# Patient Record
Sex: Female | Born: 1945 | Race: White | Hispanic: No | Marital: Married | Smoking: Never smoker
Health system: Southern US, Community
[De-identification: ages and names within clinical notes are randomized; demographics above are authoritative.]

## PROBLEM LIST (undated history)

## (undated) DIAGNOSIS — D649 Anemia, unspecified: Secondary | ICD-10-CM

## (undated) DIAGNOSIS — Z972 Presence of dental prosthetic device (complete) (partial): Secondary | ICD-10-CM

## (undated) DIAGNOSIS — K5521 Angiodysplasia of colon with hemorrhage: Secondary | ICD-10-CM

## (undated) DIAGNOSIS — N3281 Overactive bladder: Secondary | ICD-10-CM

## (undated) DIAGNOSIS — IMO0002 Reserved for concepts with insufficient information to code with codable children: Secondary | ICD-10-CM

## (undated) DIAGNOSIS — F341 Dysthymic disorder: Secondary | ICD-10-CM

## (undated) DIAGNOSIS — Z9989 Dependence on other enabling machines and devices: Secondary | ICD-10-CM

## (undated) DIAGNOSIS — C4491 Basal cell carcinoma of skin, unspecified: Secondary | ICD-10-CM

## (undated) DIAGNOSIS — K2970 Gastritis, unspecified, without bleeding: Secondary | ICD-10-CM

## (undated) DIAGNOSIS — J309 Allergic rhinitis, unspecified: Secondary | ICD-10-CM

## (undated) DIAGNOSIS — J45909 Unspecified asthma, uncomplicated: Secondary | ICD-10-CM

## (undated) DIAGNOSIS — E669 Obesity, unspecified: Secondary | ICD-10-CM

## (undated) DIAGNOSIS — D6851 Activated protein C resistance: Secondary | ICD-10-CM

## (undated) DIAGNOSIS — G4733 Obstructive sleep apnea (adult) (pediatric): Secondary | ICD-10-CM

## (undated) DIAGNOSIS — M1990 Unspecified osteoarthritis, unspecified site: Secondary | ICD-10-CM

## (undated) DIAGNOSIS — K219 Gastro-esophageal reflux disease without esophagitis: Secondary | ICD-10-CM

## (undated) DIAGNOSIS — F329 Major depressive disorder, single episode, unspecified: Secondary | ICD-10-CM

## (undated) DIAGNOSIS — F32A Depression, unspecified: Secondary | ICD-10-CM

## (undated) DIAGNOSIS — K31819 Angiodysplasia of stomach and duodenum without bleeding: Secondary | ICD-10-CM

## (undated) DIAGNOSIS — H3530 Unspecified macular degeneration: Secondary | ICD-10-CM

## (undated) HISTORY — DX: Obesity, unspecified: E66.9

## (undated) HISTORY — DX: Activated protein C resistance: D68.51

## (undated) HISTORY — DX: Dysthymic disorder: F34.1

## (undated) HISTORY — DX: Angiodysplasia of stomach and duodenum without bleeding: K31.819

## (undated) HISTORY — DX: Major depressive disorder, single episode, unspecified: F32.9

## (undated) HISTORY — DX: Depression, unspecified: F32.A

## (undated) HISTORY — DX: Gastro-esophageal reflux disease without esophagitis: K21.9

## (undated) HISTORY — DX: Anemia, unspecified: D64.9

## (undated) HISTORY — DX: Unspecified macular degeneration: H35.30

## (undated) HISTORY — DX: Basal cell carcinoma of skin, unspecified: C44.91

## (undated) HISTORY — DX: Unspecified asthma, uncomplicated: J45.909

## (undated) HISTORY — DX: Overactive bladder: N32.81

## (undated) HISTORY — DX: Obstructive sleep apnea (adult) (pediatric): G47.33

## (undated) HISTORY — DX: Angiodysplasia of colon with hemorrhage: K55.21

## (undated) HISTORY — DX: Dependence on other enabling machines and devices: Z99.89

## (undated) HISTORY — DX: Gastritis, unspecified, without bleeding: K29.70

## (undated) HISTORY — DX: Reserved for concepts with insufficient information to code with codable children: IMO0002

## (undated) HISTORY — DX: Allergic rhinitis, unspecified: J30.9

## (undated) HISTORY — PX: TONSILLECTOMY: SUR1361

---

## 1991-02-24 HISTORY — PX: CHOLECYSTECTOMY: SHX55

## 1994-02-23 HISTORY — PX: ABDOMINAL HYSTERECTOMY: SHX81

## 2004-01-29 ENCOUNTER — Ambulatory Visit: Payer: Self-pay | Admitting: Internal Medicine

## 2004-02-07 ENCOUNTER — Ambulatory Visit: Payer: Self-pay

## 2004-04-24 ENCOUNTER — Ambulatory Visit: Payer: Self-pay | Admitting: Internal Medicine

## 2004-04-29 ENCOUNTER — Encounter: Payer: Self-pay | Admitting: Internal Medicine

## 2004-05-12 ENCOUNTER — Ambulatory Visit: Payer: Self-pay | Admitting: Internal Medicine

## 2004-07-30 ENCOUNTER — Ambulatory Visit: Payer: Self-pay | Admitting: Internal Medicine

## 2004-09-16 ENCOUNTER — Encounter: Payer: Self-pay | Admitting: Gastroenterology

## 2004-10-02 ENCOUNTER — Ambulatory Visit: Payer: Self-pay | Admitting: Internal Medicine

## 2004-12-08 ENCOUNTER — Ambulatory Visit: Payer: Self-pay | Admitting: Internal Medicine

## 2005-01-12 ENCOUNTER — Ambulatory Visit: Payer: Self-pay | Admitting: Internal Medicine

## 2005-02-09 ENCOUNTER — Ambulatory Visit: Payer: Self-pay | Admitting: Family Medicine

## 2005-02-27 ENCOUNTER — Ambulatory Visit: Payer: Self-pay | Admitting: Internal Medicine

## 2005-09-14 ENCOUNTER — Ambulatory Visit: Payer: Self-pay | Admitting: Internal Medicine

## 2005-11-16 ENCOUNTER — Ambulatory Visit: Payer: Self-pay | Admitting: Internal Medicine

## 2005-12-29 ENCOUNTER — Ambulatory Visit: Payer: Self-pay | Admitting: Internal Medicine

## 2006-01-28 ENCOUNTER — Ambulatory Visit: Payer: Self-pay | Admitting: Internal Medicine

## 2006-06-10 ENCOUNTER — Encounter: Payer: Self-pay | Admitting: Family Medicine

## 2006-06-10 ENCOUNTER — Ambulatory Visit: Payer: Self-pay | Admitting: Family Medicine

## 2006-06-10 DIAGNOSIS — K219 Gastro-esophageal reflux disease without esophagitis: Secondary | ICD-10-CM | POA: Insufficient documentation

## 2006-06-10 DIAGNOSIS — M1990 Unspecified osteoarthritis, unspecified site: Secondary | ICD-10-CM | POA: Insufficient documentation

## 2006-06-10 DIAGNOSIS — E669 Obesity, unspecified: Secondary | ICD-10-CM | POA: Insufficient documentation

## 2006-06-10 DIAGNOSIS — J309 Allergic rhinitis, unspecified: Secondary | ICD-10-CM | POA: Insufficient documentation

## 2006-06-10 LAB — CONVERTED CEMR LAB
Bilirubin Urine: NEGATIVE
Glucose, Urine, Semiquant: NEGATIVE
Specific Gravity, Urine: 1.02
pH: 6

## 2006-08-23 ENCOUNTER — Ambulatory Visit: Payer: Self-pay | Admitting: Internal Medicine

## 2006-08-23 DIAGNOSIS — R42 Dizziness and giddiness: Secondary | ICD-10-CM | POA: Insufficient documentation

## 2006-08-24 LAB — CONVERTED CEMR LAB
Alkaline Phosphatase: 105 units/L (ref 39–117)
Calcium: 9.6 mg/dL (ref 8.4–10.5)
Chloride: 110 meq/L (ref 96–112)
Eosinophils Absolute: 0.2 10*3/uL (ref 0.0–0.6)
Eosinophils Relative: 2.1 % (ref 0.0–5.0)
GFR calc Af Amer: 109 mL/min
GFR calc non Af Amer: 90 mL/min
Glucose, Bld: 85 mg/dL (ref 70–99)
Lymphocytes Relative: 28.6 % (ref 12.0–46.0)
MCHC: 32.8 g/dL (ref 30.0–36.0)
MCV: 81.6 fL (ref 78.0–100.0)
Monocytes Relative: 5.7 % (ref 3.0–11.0)
Neutro Abs: 5.1 10*3/uL (ref 1.4–7.7)
Platelets: 344 10*3/uL (ref 150–400)
Total Protein: 8 g/dL (ref 6.0–8.3)

## 2006-09-16 ENCOUNTER — Telehealth (INDEPENDENT_AMBULATORY_CARE_PROVIDER_SITE_OTHER): Payer: Self-pay | Admitting: *Deleted

## 2006-10-06 DIAGNOSIS — G479 Sleep disorder, unspecified: Secondary | ICD-10-CM | POA: Insufficient documentation

## 2006-10-06 DIAGNOSIS — H3530 Unspecified macular degeneration: Secondary | ICD-10-CM | POA: Insufficient documentation

## 2006-10-06 DIAGNOSIS — D682 Hereditary deficiency of other clotting factors: Secondary | ICD-10-CM | POA: Insufficient documentation

## 2006-10-13 ENCOUNTER — Ambulatory Visit: Payer: Self-pay | Admitting: Internal Medicine

## 2006-10-13 DIAGNOSIS — F438 Other reactions to severe stress: Secondary | ICD-10-CM

## 2006-10-13 DIAGNOSIS — F4389 Other reactions to severe stress: Secondary | ICD-10-CM | POA: Insufficient documentation

## 2006-12-16 ENCOUNTER — Ambulatory Visit: Payer: Self-pay | Admitting: Internal Medicine

## 2007-01-06 ENCOUNTER — Ambulatory Visit: Payer: Self-pay | Admitting: Internal Medicine

## 2007-01-06 LAB — CONVERTED CEMR LAB
Cholesterol: 159 mg/dL (ref 0–200)
HDL: 50.2 mg/dL (ref >39.0)
LDL Cholesterol: 94 mg/dL (ref 0–99)

## 2007-04-04 ENCOUNTER — Ambulatory Visit: Payer: Self-pay | Admitting: Internal Medicine

## 2007-04-04 ENCOUNTER — Encounter: Payer: Self-pay | Admitting: Internal Medicine

## 2007-04-05 ENCOUNTER — Encounter (INDEPENDENT_AMBULATORY_CARE_PROVIDER_SITE_OTHER): Payer: Self-pay | Admitting: *Deleted

## 2007-05-20 ENCOUNTER — Ambulatory Visit: Payer: Self-pay | Admitting: Internal Medicine

## 2007-05-26 ENCOUNTER — Ambulatory Visit: Payer: Self-pay | Admitting: Internal Medicine

## 2007-06-22 ENCOUNTER — Ambulatory Visit: Payer: Self-pay | Admitting: Family Medicine

## 2007-06-24 ENCOUNTER — Encounter: Payer: Self-pay | Admitting: Internal Medicine

## 2007-06-28 ENCOUNTER — Ambulatory Visit: Payer: Self-pay | Admitting: Internal Medicine

## 2007-06-30 ENCOUNTER — Telehealth: Payer: Self-pay | Admitting: Internal Medicine

## 2007-07-05 ENCOUNTER — Encounter: Payer: Self-pay | Admitting: Internal Medicine

## 2008-02-06 ENCOUNTER — Ambulatory Visit: Payer: Self-pay | Admitting: Family Medicine

## 2008-03-23 ENCOUNTER — Ambulatory Visit: Payer: Self-pay | Admitting: Family Medicine

## 2008-04-04 ENCOUNTER — Ambulatory Visit: Payer: Self-pay | Admitting: Family Medicine

## 2008-04-17 ENCOUNTER — Ambulatory Visit: Payer: Self-pay | Admitting: Family Medicine

## 2008-05-22 ENCOUNTER — Encounter: Payer: Self-pay | Admitting: Internal Medicine

## 2008-11-22 ENCOUNTER — Ambulatory Visit: Payer: Self-pay | Admitting: Family Medicine

## 2008-11-23 ENCOUNTER — Ambulatory Visit: Payer: Self-pay | Admitting: Family Medicine

## 2008-12-14 ENCOUNTER — Ambulatory Visit: Payer: Self-pay | Admitting: Family Medicine

## 2008-12-24 ENCOUNTER — Ambulatory Visit: Payer: Self-pay | Admitting: Family Medicine

## 2009-02-01 ENCOUNTER — Ambulatory Visit: Payer: Self-pay | Admitting: Family Medicine

## 2009-02-04 ENCOUNTER — Encounter: Payer: Self-pay | Admitting: Internal Medicine

## 2009-02-23 ENCOUNTER — Ambulatory Visit: Payer: Self-pay | Admitting: Family Medicine

## 2009-02-25 ENCOUNTER — Ambulatory Visit: Payer: Self-pay | Admitting: Family Medicine

## 2009-03-26 ENCOUNTER — Ambulatory Visit: Payer: Self-pay | Admitting: Family Medicine

## 2009-04-09 ENCOUNTER — Ambulatory Visit: Payer: Self-pay | Admitting: Neurology

## 2009-04-16 ENCOUNTER — Ambulatory Visit: Payer: Self-pay | Admitting: Neurology

## 2009-05-16 ENCOUNTER — Ambulatory Visit: Payer: Self-pay | Admitting: Family Medicine

## 2009-05-22 ENCOUNTER — Ambulatory Visit: Payer: Self-pay | Admitting: Family Medicine

## 2009-05-24 ENCOUNTER — Ambulatory Visit: Payer: Self-pay | Admitting: Family Medicine

## 2009-07-12 ENCOUNTER — Ambulatory Visit: Payer: Self-pay | Admitting: Family Medicine

## 2009-07-24 ENCOUNTER — Ambulatory Visit: Payer: Self-pay | Admitting: Family Medicine

## 2009-08-23 ENCOUNTER — Ambulatory Visit: Payer: Self-pay | Admitting: Family Medicine

## 2009-09-23 ENCOUNTER — Ambulatory Visit: Payer: Self-pay | Admitting: Family Medicine

## 2009-11-01 ENCOUNTER — Ambulatory Visit: Payer: Self-pay | Admitting: Family Medicine

## 2009-11-23 ENCOUNTER — Ambulatory Visit: Payer: Self-pay | Admitting: Family Medicine

## 2009-12-24 ENCOUNTER — Ambulatory Visit: Payer: Self-pay | Admitting: Family Medicine

## 2010-02-28 ENCOUNTER — Ambulatory Visit: Payer: Self-pay | Admitting: Family Medicine

## 2010-03-26 ENCOUNTER — Ambulatory Visit: Payer: Self-pay | Admitting: Family Medicine

## 2010-05-28 ENCOUNTER — Ambulatory Visit: Payer: Self-pay | Admitting: Family Medicine

## 2010-06-11 ENCOUNTER — Ambulatory Visit: Payer: Self-pay | Admitting: Family Medicine

## 2010-06-24 ENCOUNTER — Ambulatory Visit: Payer: Self-pay | Admitting: Family Medicine

## 2010-07-11 NOTE — Assessment & Plan Note (Signed)
[facility]                                 ON-CALL NOTE   NAME:Ashline, KHIANA CAMINO                       MRN:          [id number]  DATE:06/29/2007                            DOB:          April 02, 1945    The beeper said the call came in from [facility], but I spoke  directly to the patient.  She is a patient of Dr. Karle Starch.  The call  came in on Jun 29, 2007 at 6:02 p.m.   PHONE NUMBER:  [phone].   CHIEF COMPLAINT:  Cough.   Patient said she had pneumonia back in March, which she thinks has  cleared up.  Last week, she developed another cough.  She saw Dr.  Ermalene Searing in the office and was given a cough medicine with codeine and  told she probably had a virus.  She said at that time she ended up  having an allergic reaction to CODEINE and had to stop it over the  weekend.  She saw Dr. Alphonsus Sias again yesterday.  At that point, she said a  chest x-ray, she thinks that was done on Friday, showed a small streak  that was more consistent with scarring than infection.  He put her on  some Prilosec to see if reflux was causing her cough and referred her to  Pulmonary at [facility] on Tuesday.  She said, however, during the day today,  she has gotten progressively worse with weakness, headache, and chills.  She has not taken her temperature yet but suspects she may have a fever.  She is having right-sided back pain and is very worried she may be  developing pneumonia again.   I told her, given the fact that she is getting so much worse today, to  go ahead to the emergency room and get checked out now, and that is what  she is going to do.     Marne A. Tower, MD  Electronically Signed    MAT/MedQ  DD: 06/29/2007  DT: 06/29/2007  Job #: [id number]

## 2011-04-20 DIAGNOSIS — Z209 Contact with and (suspected) exposure to unspecified communicable disease: Secondary | ICD-10-CM | POA: Diagnosis not present

## 2011-04-20 DIAGNOSIS — R7309 Other abnormal glucose: Secondary | ICD-10-CM | POA: Diagnosis not present

## 2011-04-20 DIAGNOSIS — D509 Iron deficiency anemia, unspecified: Secondary | ICD-10-CM | POA: Diagnosis not present

## 2011-04-24 DIAGNOSIS — D649 Anemia, unspecified: Secondary | ICD-10-CM | POA: Diagnosis not present

## 2011-04-24 DIAGNOSIS — R7309 Other abnormal glucose: Secondary | ICD-10-CM | POA: Diagnosis not present

## 2011-04-24 DIAGNOSIS — F329 Major depressive disorder, single episode, unspecified: Secondary | ICD-10-CM | POA: Diagnosis not present

## 2011-04-24 DIAGNOSIS — Z0000 Encounter for general adult medical examination without abnormal findings: Secondary | ICD-10-CM | POA: Diagnosis not present

## 2011-04-24 DIAGNOSIS — F3289 Other specified depressive episodes: Secondary | ICD-10-CM | POA: Diagnosis not present

## 2011-04-24 DIAGNOSIS — Z01419 Encounter for gynecological examination (general) (routine) without abnormal findings: Secondary | ICD-10-CM | POA: Diagnosis not present

## 2011-06-18 DIAGNOSIS — Z0000 Encounter for general adult medical examination without abnormal findings: Secondary | ICD-10-CM | POA: Diagnosis not present

## 2011-06-18 DIAGNOSIS — J209 Acute bronchitis, unspecified: Secondary | ICD-10-CM | POA: Diagnosis not present

## 2011-06-18 DIAGNOSIS — J309 Allergic rhinitis, unspecified: Secondary | ICD-10-CM | POA: Diagnosis not present

## 2011-06-18 DIAGNOSIS — J45909 Unspecified asthma, uncomplicated: Secondary | ICD-10-CM | POA: Diagnosis not present

## 2011-07-07 ENCOUNTER — Ambulatory Visit: Payer: Self-pay | Admitting: Family Medicine

## 2011-07-07 DIAGNOSIS — R928 Other abnormal and inconclusive findings on diagnostic imaging of breast: Secondary | ICD-10-CM | POA: Diagnosis not present

## 2011-07-07 DIAGNOSIS — Z1231 Encounter for screening mammogram for malignant neoplasm of breast: Secondary | ICD-10-CM | POA: Diagnosis not present

## 2011-07-07 LAB — HM MAMMOGRAPHY

## 2011-09-17 DIAGNOSIS — D649 Anemia, unspecified: Secondary | ICD-10-CM | POA: Diagnosis not present

## 2011-09-17 DIAGNOSIS — D689 Coagulation defect, unspecified: Secondary | ICD-10-CM | POA: Diagnosis not present

## 2011-09-17 DIAGNOSIS — Z0000 Encounter for general adult medical examination without abnormal findings: Secondary | ICD-10-CM | POA: Diagnosis not present

## 2011-09-17 DIAGNOSIS — R5381 Other malaise: Secondary | ICD-10-CM | POA: Diagnosis not present

## 2011-09-17 DIAGNOSIS — M940 Chondrocostal junction syndrome [Tietze]: Secondary | ICD-10-CM | POA: Diagnosis not present

## 2011-09-17 DIAGNOSIS — R079 Chest pain, unspecified: Secondary | ICD-10-CM | POA: Diagnosis not present

## 2011-09-17 LAB — CBC AND DIFFERENTIAL: Platelets: 454 10*3/uL — AB (ref 150–399)

## 2011-09-28 DIAGNOSIS — R5381 Other malaise: Secondary | ICD-10-CM | POA: Diagnosis not present

## 2011-09-28 DIAGNOSIS — D509 Iron deficiency anemia, unspecified: Secondary | ICD-10-CM | POA: Diagnosis not present

## 2011-09-28 DIAGNOSIS — Z0000 Encounter for general adult medical examination without abnormal findings: Secondary | ICD-10-CM | POA: Diagnosis not present

## 2011-09-28 DIAGNOSIS — R079 Chest pain, unspecified: Secondary | ICD-10-CM | POA: Diagnosis not present

## 2011-09-28 DIAGNOSIS — M940 Chondrocostal junction syndrome [Tietze]: Secondary | ICD-10-CM | POA: Diagnosis not present

## 2011-10-02 DIAGNOSIS — D509 Iron deficiency anemia, unspecified: Secondary | ICD-10-CM | POA: Diagnosis not present

## 2011-10-07 ENCOUNTER — Ambulatory Visit (INDEPENDENT_AMBULATORY_CARE_PROVIDER_SITE_OTHER): Payer: Medicare Other | Admitting: Family Medicine

## 2011-10-07 VITALS — BP 144/70 | HR 79 | Temp 98.8°F | Resp 16 | Ht 62.0 in | Wt 234.0 lb

## 2011-10-07 DIAGNOSIS — D509 Iron deficiency anemia, unspecified: Secondary | ICD-10-CM | POA: Diagnosis not present

## 2011-10-07 DIAGNOSIS — M940 Chondrocostal junction syndrome [Tietze]: Secondary | ICD-10-CM

## 2011-10-07 DIAGNOSIS — R195 Other fecal abnormalities: Secondary | ICD-10-CM

## 2011-10-07 DIAGNOSIS — Z8711 Personal history of peptic ulcer disease: Secondary | ICD-10-CM

## 2011-10-07 LAB — POCT CBC
Granulocyte percent: 42.7 %G (ref 37–80)
HCT, POC: 32.6 % — AB (ref 37.7–47.9)
POC Granulocyte: 2.6 (ref 2–6.9)
POC LYMPH PERCENT: 50.3 %L — AB (ref 10–50)
Platelet Count, POC: 423 10*3/uL (ref 142–424)
RBC: 4.11 M/uL (ref 4.04–5.48)
RDW, POC: 17.1 %

## 2011-10-07 NOTE — Patient Instructions (Addendum)
1. Anemia, iron deficiency  POCT CBC   Your anemia is improving; continue iron twice daily for next month.  Return to clinic immediately for diarrhea, black stools, nausea, abdominal pain, dizziness, lightheaded.

## 2011-10-07 NOTE — Progress Notes (Signed)
Subjective:    Patient ID: Faith Thompson, female    DOB: 08-May-1945, 66 y.o.   MRN: [id number]  HPIThis 66 y.o. female presents for evaluation of anemia.   Taking Aleve from June to July for R knee pain.  Then continued Aleve for costochondritis.  Stopped Aleve three weeks ago.  Hgb of 9.5 in 09/18/11.  Repeat labs on 09/28/11 of 8.4.  Repeat was 9.0 on 10/02/11.  Now taking Prilosec 20mg  bid and ferrous sulfate 325mg  bid; also taking stool softener once daily.  No black stools; no bloody stools.  No abdominal pain;  +nausea with decreased appetite; intermittent stomach upset.  One month duration.  History of PUD.  Appointment with GI on 11/11/11.  No dizziness, +lightheaded at times and occurs upon awakening.  No chest pain or shortness of breath.  Less fatigue; drinking minimally.    2.  Costochondiritis: much improved. Onset after lifting heavy mirror at home.       Review of Systems  Constitutional: Negative for chills, diaphoresis, activity change, appetite change, fatigue and unexpected weight change.  Respiratory: Negative for shortness of breath.   Cardiovascular: Negative for chest pain, palpitations and leg swelling.  Gastrointestinal: Negative for nausea, vomiting, abdominal pain, diarrhea, constipation, blood in stool and abdominal distention.  Neurological: Positive for light-headedness. Negative for dizziness, syncope and headaches.  Hematological: Negative for adenopathy. Does not bruise/bleed easily.    Past Medical History  Diagnosis Date  . Anemia   . Asthma   . Depression   . Ulcer   . Overactive bladder   . Allergy     No past surgical history on file.  Prior to Admission medications   Medication Sig Start Date End Date Taking? Authorizing Provider  abacavir (ZIAGEN) 20 MG/ML solution Take 8 mg/kg by mouth 2 (two) times daily.   Yes Historical Provider, MD  calcium carbonate (OS-CAL) 600 MG TABS Take 600 mg by mouth 2 (two) times daily with a meal.   Yes Historical  Provider, MD  cetirizine (ZYRTEC) 10 MG tablet Take 10 mg by mouth daily.   Yes Historical Provider, MD  ferrous sulfate 325 (65 FE) MG tablet Take 325 mg by mouth daily with breakfast.   Yes Historical Provider, MD  fish oil-omega-3 fatty acids 1000 MG capsule Take 2 g by mouth daily.   Yes Historical Provider, MD  omeprazole (PRILOSEC) 20 MG capsule Take 20 mg by mouth 2 (two) times daily.   Yes Historical Provider, MD  oxybutynin (DITROPAN-XL) 10 MG 24 hr tablet Take 10 mg by mouth daily.   Yes Historical Provider, MD    Allergies  Allergen Reactions  . Codeine Itching    History   Social History  . Marital Status: Married    Spouse Name: N/A    Number of Children: N/A  . Years of Education: N/A   Occupational History  . Not on file.   Social History Main Topics  . Smoking status: Never Smoker   . Smokeless tobacco: Not on file  . Alcohol Use: No  . Drug Use: No  . Sexually Active: Yes   Other Topics Concern  . Not on file   Social History Narrative  . No narrative on file    No family history on file.     Objective:   Physical Exam  Constitutional: She is oriented to person, place, and time. She appears well-developed and well-nourished. No distress.  HENT:  Head: Normocephalic and atraumatic.  Eyes: Conjunctivae and  EOM are normal. Pupils are equal, round, and reactive to light.  Neck: Normal range of motion. Neck supple. No thyromegaly present.  Cardiovascular: Normal rate, regular rhythm, normal heart sounds and intact distal pulses.   No murmur heard. Pulmonary/Chest: Effort normal and breath sounds normal. No respiratory distress. She exhibits no tenderness.  Abdominal: Soft. Bowel sounds are normal. She exhibits no distension and no mass. There is no tenderness. There is no rebound and no guarding.  Lymphadenopathy:    She has no cervical adenopathy.  Neurological: She is alert and oriented to person, place, and time. No cranial nerve deficit. She  exhibits normal muscle tone. Coordination normal.  Skin: Skin is warm and dry. She is not diaphoretic.  Psychiatric: She has a normal mood and affect. Her behavior is normal. Judgment and thought content normal.      Results for orders placed in visit on 10/07/11  POCT CBC      Component Value Range   WBC 6.0  4.6 - 10.2 K/uL   Lymph, poc 3.0  0.6 - 3.4   POC LYMPH PERCENT 50.3 (*) 10 - 50 %L   MID (cbc) 0.4  0 - 0.9   POC MID % 7.0  0 - 12 %M   POC Granulocyte 2.6  2 - 6.9   Granulocyte percent 42.7  37 - 80 %G   RBC 4.11  4.04 - 5.48 M/uL   Hemoglobin 9.4 (*) 12.2 - 16.2 g/dL   HCT, POC 16.1 (*) 09.6 - 47.9 %   MCV 79.4 (*) 80 - 97 fL   MCH, POC 22.9 (*) 27 - 31.2 pg   MCHC 28.8 (*) 31.8 - 35.4 g/dL   RDW, POC 04.5     Platelet Count, POC 423  142 - 424 K/uL   MPV 7.2  0 - 99.8 fL      Assessment & Plan:   1. Anemia, iron deficiency  POCT CBC   2.  Costochondritis. 3.  History of PUD   1.  Anemia iron deficiency: new.  OC light + at [facility].  Appointment with GI on 11/09/11 for consultation.  Tolerating Ferrous Sulfate 325mg  bid; no change in medication today.  Continue stool softener. 2. Stool hemoccult +: New.  History of PUD; history of recent NSAID use daily x 3 months.  Stop all NSAIDs; agree with increasing Omeprazole to bid.  Appointment with GI on 11/09/11.  To ED for bloody stools or melanotic stools or hematemesis.   3.  History of PUD: 2010.   4.  Costochondritis: Improved/resolved.

## 2011-10-10 ENCOUNTER — Encounter: Payer: Self-pay | Admitting: Family Medicine

## 2011-10-16 NOTE — Progress Notes (Signed)
Reviewed and agree.

## 2011-10-24 ENCOUNTER — Other Ambulatory Visit: Payer: Self-pay | Admitting: Family Medicine

## 2011-10-25 HISTORY — PX: COLONOSCOPY: SHX174

## 2011-10-25 HISTORY — PX: ESOPHAGOGASTRODUODENOSCOPY: SHX1529

## 2011-10-28 ENCOUNTER — Ambulatory Visit (INDEPENDENT_AMBULATORY_CARE_PROVIDER_SITE_OTHER): Payer: Medicare Other | Admitting: Family Medicine

## 2011-10-28 ENCOUNTER — Encounter: Payer: Self-pay | Admitting: Family Medicine

## 2011-10-28 VITALS — BP 110/67 | HR 78 | Temp 98.1°F | Resp 16 | Ht 63.0 in | Wt 236.0 lb

## 2011-10-28 DIAGNOSIS — F329 Major depressive disorder, single episode, unspecified: Secondary | ICD-10-CM | POA: Diagnosis not present

## 2011-10-28 DIAGNOSIS — D509 Iron deficiency anemia, unspecified: Secondary | ICD-10-CM | POA: Diagnosis not present

## 2011-10-28 DIAGNOSIS — R5383 Other fatigue: Secondary | ICD-10-CM

## 2011-10-28 DIAGNOSIS — F32A Depression, unspecified: Secondary | ICD-10-CM

## 2011-10-28 DIAGNOSIS — G4700 Insomnia, unspecified: Secondary | ICD-10-CM | POA: Diagnosis not present

## 2011-10-28 DIAGNOSIS — R5381 Other malaise: Secondary | ICD-10-CM

## 2011-10-28 DIAGNOSIS — F3289 Other specified depressive episodes: Secondary | ICD-10-CM | POA: Diagnosis not present

## 2011-10-28 LAB — COMPREHENSIVE METABOLIC PANEL
CO2: 28 mEq/L (ref 19–32)
Creat: 0.75 mg/dL (ref 0.50–1.10)
Glucose, Bld: 92 mg/dL (ref 70–99)
Total Bilirubin: 0.3 mg/dL (ref 0.3–1.2)

## 2011-10-28 LAB — IRON AND TIBC: Iron: 382 ug/dL — ABNORMAL HIGH (ref 42–145)

## 2011-10-28 LAB — TSH: TSH: 1.982 u[IU]/mL (ref 0.350–4.500)

## 2011-10-28 LAB — POCT CBC
HCT, POC: 36.3 % — AB (ref 37.7–47.9)
Hemoglobin: 10.5 g/dL — AB (ref 12.2–16.2)
Lymph, poc: 2.8 (ref 0.6–3.4)
MCH, POC: 23.5 pg — AB (ref 27–31.2)
MCHC: 28.9 g/dL — AB (ref 31.8–35.4)
POC MID %: 6.5 %M (ref 0–12)
WBC: 6.8 10*3/uL (ref 4.6–10.2)

## 2011-10-28 NOTE — Patient Instructions (Addendum)
1. Fatigue  Comprehensive metabolic panel, TSH  2. Insomnia    3. Anemia, iron deficiency  POCT CBC, Iron and TIBC  4. Depression       Start taking Tylenol PM one tablet at bedtime for next 2-6 weeks.

## 2011-10-28 NOTE — Progress Notes (Signed)
Subjective:    Patient ID: Faith Thompson, female    DOB: [birth year], 66 y.o.   MRN: [id number]  HPI This 66 y.o. female presents for evaluation of  Subjective:     Faith Thompson is a 66 y.o. female who presents for evaluation of anemia. Anemia was found by routine CBC.  It has been present for 3 month.  Associated signs & symptoms: abdominal pain. The following portions of the patient's history were reviewed and updated as appropriate: allergies, current medications, past family history, past medical history, past social history, past surgical history and problem list.  Had made appointment with Genelle Gather for 11/11/11 for EGD and colonoscopy.  Upon return from [place] and unable to see pt sooner.  Last visit 3 weeks ago; thinks needs repeat labs.  Still suffering with fatigue.  Was tired after trip in [place].  Since return from [place], has suffered with fatigue.  Last visit, husband kept mouth shut.  Husband and daughters worried about problem.  OC light + but source of bleeding unknown.  Has consultation on 11/12/11 with GI.  Husband frustrated by need for consultation.  Family frustrated by wait.    2.  Depression:  No motivation to do anything; husband thinks that pt depressed.  Saw pt two years ago with depression.  Increased Citalopram to 40mg  daily two years ago.  For past year, discussing need for therapist.  Pt reluctant to undergo psychotherapy.  If blood levels better, then husband really concerned about depression.  Appetite is down which concerns husband.  Was previously exercising for 45 minutes; mentally good.  Not exercising due to fatigue.  Does not want to go out; did go out with [employer] this moring; shopped for granddaughter; ready to come home.  Mother's Tennis Must is coming up; did think about it in the truck.  Stays up until 1:00 reading; always gets up at 7:00.  Started staying up late in last six months.  Unable to sleep.  Mind is racing.  Excessive worry about granddaughter;  grandson returned to college and has little man syndrome.  Worried about Byrd Hesselbach also.  No caffeine.  2 cups in am. Coffee.  Decreased coke intake.  Started drinking prune juice and pink grape fruit juice.                             Review of Systems  Constitutional: Negative for fever, chills and diaphoresis.  Respiratory: Negative for shortness of breath.   Cardiovascular: Negative for chest pain, palpitations and leg swelling.  Gastrointestinal: Negative for nausea, vomiting, abdominal pain, diarrhea, constipation, blood in stool, anal bleeding and rectal pain.  Skin: Negative for color change.  Hematological: Negative for adenopathy. Does not bruise/bleed easily.  Psychiatric/Behavioral: Positive for disturbed wake/sleep cycle and dysphoric mood. Negative for suicidal ideas, confusion, self-injury and agitation. The patient is nervous/anxious.        Objective:   Physical Exam  Constitutional: She is oriented to person, place, and time.  HENT:  Mouth/Throat: Oropharynx is clear and moist.  Eyes: Conjunctivae normal are normal. Pupils are equal, round, and reactive to light.  Neck: Normal range of motion. Neck supple. No thyromegaly present.  Cardiovascular: Normal rate, regular rhythm, normal heart sounds and intact distal pulses.   No murmur heard. Pulmonary/Chest: Effort normal and breath sounds normal.  Abdominal: Soft. Bowel sounds are normal. She exhibits no distension. There is no tenderness. There is no rebound and no guarding.  Lymphadenopathy:    She has no cervical adenopathy.  Neurological: She is alert and oriented to person, place, and time. She has normal reflexes. No cranial nerve deficit. She exhibits normal muscle tone. Coordination normal.  Skin: Skin is warm and dry.  Psychiatric: She has a normal mood and affect. Her behavior is normal. Judgment and thought content normal.      Results for orders placed in visit on 10/28/11  POCT CBC      Component  Value Range   WBC 6.8  4.6 - 10.2 K/uL   Lymph, poc 2.8  0.6 - 3.4   POC LYMPH PERCENT 41.6  10 - 50 %L   MID (cbc) 0.4  0 - 0.9   POC MID % 6.5  0 - 12 %M   POC Granulocyte 3.5  2 - 6.9   Granulocyte percent 51.9  37 - 80 %G   RBC 4.47  4.04 - 5.48 M/uL   Hemoglobin 10.5 (*) 12.2 - 16.2 g/dL   HCT, POC 96.0 (*) 45.4 - 47.9 %   MCV 81.3  80 - 97 fL   MCH, POC 23.5 (*) 27 - 31.2 pg   MCHC 28.9 (*) 31.8 - 35.4 g/dL   RDW, POC 09.8     Platelet Count, POC 398  142 - 424 K/uL   MPV 8.0  0 - 99.8 fL    Assessment & Plan:   1. Fatigue  Comprehensive metabolic panel, TSH  2. Insomnia    3. Anemia, iron deficiency  POCT CBC, Iron and TIBC  4. Depression      1. Fatigue: New.  Improving anemia yet developing fatigue.  Suffering with insomnia.  Will treat insomnia and reevaluate fatigue. 2.  Insomnia: new.  Onset six months ago. Recommend Tylenol PM or Unisom qhs for next month and then wean as appropriate.  Multiple family stressors in past year. 3.  Anemia Iron Deficiency: improving.  Continue current dose of iron supplementation; repeat labs in one month.  Appointment with GI 11/12/11 to schedule EGD/colonoscopy.  Reassured husband that anemia improving thus no urgent need for EGD/colonoscopy.  Hemoccult + with onset of anemia. 4.  Depression: stable; fatigue likely due to insomnia and decreased sleep hours.  If fatigue does not improve with treatment of insomnia, must consider worsening depression as contributing etiology to fatigue.  Close follow-up.

## 2011-10-29 DIAGNOSIS — H524 Presbyopia: Secondary | ICD-10-CM | POA: Diagnosis not present

## 2011-10-29 DIAGNOSIS — H25019 Cortical age-related cataract, unspecified eye: Secondary | ICD-10-CM | POA: Diagnosis not present

## 2011-10-29 DIAGNOSIS — H3530 Unspecified macular degeneration: Secondary | ICD-10-CM | POA: Diagnosis not present

## 2011-11-05 ENCOUNTER — Telehealth: Payer: Self-pay

## 2011-11-05 NOTE — Telephone Encounter (Signed)
Call --- UIBC level is low when there are NORMAL  iron levels.  No need to be concerned.  KMS

## 2011-11-05 NOTE — Telephone Encounter (Signed)
I have advised patient, she also had me advise her husband.

## 2011-11-05 NOTE — Telephone Encounter (Signed)
Pt would like to talk with Dr. Katrinka Blazing regarding her test results.    Call [phone]

## 2011-11-05 NOTE — Telephone Encounter (Signed)
Pt asking about UIBC level on labs, this number was low less than 15, she and her husband want to know what this test is and if they should be concerned about low level. Patient does take iron suppliment. Please advise what I should advise patient and her husband.

## 2011-11-09 ENCOUNTER — Encounter: Payer: Self-pay | Admitting: *Deleted

## 2011-11-09 ENCOUNTER — Ambulatory Visit: Payer: Self-pay | Admitting: Family Medicine

## 2011-11-12 DIAGNOSIS — K2970 Gastritis, unspecified, without bleeding: Secondary | ICD-10-CM | POA: Diagnosis not present

## 2011-11-12 DIAGNOSIS — K625 Hemorrhage of anus and rectum: Secondary | ICD-10-CM | POA: Diagnosis not present

## 2011-11-12 DIAGNOSIS — Z79899 Other long term (current) drug therapy: Secondary | ICD-10-CM | POA: Diagnosis not present

## 2011-11-12 DIAGNOSIS — D682 Hereditary deficiency of other clotting factors: Secondary | ICD-10-CM | POA: Diagnosis not present

## 2011-11-12 DIAGNOSIS — D509 Iron deficiency anemia, unspecified: Secondary | ICD-10-CM | POA: Diagnosis not present

## 2011-11-16 ENCOUNTER — Ambulatory Visit: Payer: Medicare Other | Admitting: Family Medicine

## 2011-11-18 ENCOUNTER — Ambulatory Visit (INDEPENDENT_AMBULATORY_CARE_PROVIDER_SITE_OTHER): Payer: Medicare Other | Admitting: Family Medicine

## 2011-11-18 VITALS — BP 122/74 | HR 76 | Temp 98.0°F | Resp 18 | Ht 63.5 in | Wt 235.0 lb

## 2011-11-18 DIAGNOSIS — Z23 Encounter for immunization: Secondary | ICD-10-CM | POA: Diagnosis not present

## 2011-11-18 DIAGNOSIS — B9689 Other specified bacterial agents as the cause of diseases classified elsewhere: Secondary | ICD-10-CM

## 2011-11-18 DIAGNOSIS — N939 Abnormal uterine and vaginal bleeding, unspecified: Secondary | ICD-10-CM

## 2011-11-18 DIAGNOSIS — Z124 Encounter for screening for malignant neoplasm of cervix: Secondary | ICD-10-CM

## 2011-11-18 DIAGNOSIS — N898 Other specified noninflammatory disorders of vagina: Secondary | ICD-10-CM

## 2011-11-18 DIAGNOSIS — L299 Pruritus, unspecified: Secondary | ICD-10-CM

## 2011-11-18 DIAGNOSIS — N760 Acute vaginitis: Secondary | ICD-10-CM | POA: Diagnosis not present

## 2011-11-18 LAB — POCT WET PREP WITH KOH
Clue Cells Wet Prep HPF POC: 100
KOH Prep POC: NEGATIVE
Trichomonas, UA: NEGATIVE
Yeast Wet Prep HPF POC: NEGATIVE

## 2011-11-18 LAB — POCT UA - MICROSCOPIC ONLY

## 2011-11-18 LAB — POCT URINALYSIS DIPSTICK
Ketones, UA: NEGATIVE
Protein, UA: NEGATIVE
Spec Grav, UA: 1.015

## 2011-11-18 MED ORDER — TRIAMCINOLONE ACETONIDE 0.1 % EX CREA: TOPICAL_CREAM | Freq: Two times a day (BID) | CUTANEOUS | Status: DC | PRN

## 2011-11-18 MED ORDER — METRONIDAZOLE 0.75 % VA GEL: 1.0000 | Freq: Two times a day (BID) | VAGINAL | Status: DC

## 2011-11-18 NOTE — Patient Instructions (Addendum)
1. Vaginal bleeding  POCT UA - Microscopic Only, POCT urinalysis dipstick, POCT Wet Prep with KOH, Pap IG (Image Guided)  2. Vaginal Discharge  POCT Wet Prep with KOH  3. Bacterial vaginosis  metroNIDAZOLE (METROGEL VAGINAL) 0.75 % vaginal gel

## 2011-11-18 NOTE — Progress Notes (Signed)
[address]   [place], [place]  [address]   [phone]  Subjective:    Patient ID: Faith Thompson, female    DOB: 02-23-1946, 66 y.o.   MRN: [id number]  HPIThis 66 y.o. female presents for evaluation of vaginal bleeding for one to two weeks.  Varies from light spotting to bright red bleeding.  This morning light bleeding.  Dr. Maryellen Pile felt vaginal bleeding may be due to drying effect of Oxybutynin.  Stopped Oxybutynin on 11/12/11 without improvement in vaginal bleeding. No vaginal discharge; +vaginal irritation chronic.  Does not like to apply lubrication without direction/instructions of physician. Similar symptoms three years ago; Dr. Mable Paris prescribed estrogen cream; recommended not using it due to hormones with FActor V mutation; Dr. Nile Dear retired a few years ago.  No other sites of bleeding.  Scheduled for colonoscopy 12/01/11 for anemia iron deficiency.  Blood only with wiping on toilet paper.   No blood in underwear.  Yesterday, wore a poise with no bleeding.  No blood in urine.  No bloody stools.  S/p hysterectomy total for large uterine fibroid.  +itching in vaginal area this week.    2.  Flu vaccine: requesting.  3.  Neck pruritis: persistent; requesting refill on Triamcinolone cream.  No rash associated with itching.  Only occurs at home. When visits daughter in [place], itching resolves.    Review of Systems  Constitutional: Negative for fever, chills and fatigue.  Gastrointestinal: Negative for nausea, vomiting, abdominal pain, diarrhea and constipation.  Genitourinary: Positive for flank pain, vaginal bleeding and pelvic pain. Negative for dysuria, urgency, frequency, hematuria, vaginal discharge, enuresis, genital sores and vaginal pain.  Musculoskeletal: Positive for back pain.  Skin: Negative for rash.       +itching.       Past Medical History  Diagnosis Date  . Anemia   . Asthma   . Depression   . Ulcer   . Overactive bladder   . Allergic rhinitis, cause  unspecified   . GERD (gastroesophageal reflux disease)   . Factor V Leiden mutation   . Obesity, unspecified   . Dysthymic disorder   . Macular degeneration (senile) of retina, unspecified     Past Surgical History  Procedure Date  . Tonsillectomy age 52  . Cholecystectomy 1993  . Abdominal hysterectomy 1996    fibroids ovaries removed    Prior to Admission medications   Medication Sig Start Date End Date Taking? Authorizing Provider  budesonide-formoterol (SYMBICORT) 160-4.5 MCG/ACT inhaler Inhale 1 puff into the lungs 2 (two) times daily.   Yes Historical Provider, MD  calcium carbonate (OS-CAL) 600 MG TABS Take 600 mg by mouth 2 (two) times daily with a meal.   Yes Historical Provider, MD  cetirizine (ZYRTEC) 10 MG tablet Take 10 mg by mouth daily.   Yes Historical Provider, MD  ferrous sulfate 325 (65 FE) MG tablet Take 325 mg by mouth daily with breakfast.   Yes Historical Provider, MD  fish oil-omega-3 fatty acids 1000 MG capsule Take 2 g by mouth daily.   Yes Historical Provider, MD  Lutein 6 MG CAPS Take by mouth daily.   Yes Historical Provider, MD  naproxen sodium (ANAPROX) 220 MG tablet Take 220 mg by mouth 2 (two) times daily with a meal.   Yes Historical Provider, MD  omeprazole (PRILOSEC) 20 MG capsule TAKE ONE CAPSULE BY MOUTH DAILY 10/24/11  Yes Morrell Riddle, PA-C  benzonatate (TESSALON) 100 MG capsule Take 100 mg by mouth 3 (  three) times daily as needed.    Historical Provider, MD  citalopram (CELEXA) 20 MG tablet Take 20 mg by mouth daily.    Historical Provider, MD  oxybutynin (DITROPAN-XL) 10 MG 24 hr tablet Take 10 mg by mouth daily.    Historical Provider, MD  triamcinolone cream (KENALOG) 0.1 % Apply topically 2 (two) times daily as needed.    Historical Provider, MD    Allergies  Allergen Reactions  . Codeine Itching    History   Social History  . Marital Status: Married    Spouse Name: N/A    Number of Children: 3  . Years of Education: N/A    Occupational History  . Homemaker    Social History Main Topics  . Smoking status: Never Smoker   . Smokeless tobacco: Not on file  . Alcohol Use: Yes     occasional very rarely social events  . Drug Use: No  . Sexually Active: Yes   Other Topics Concern  . Not on file   Social History Narrative   Smoke alarm and carbon monoxide detector in the home.No guns in the home.Always uses seat belts. Married x 45 years Happily no abuse. Caffeine use: Coffee 2 servings per day. 4 grandchildren. Exercise: Moderate, Walking 30 minutes daily on treadmill. Patient does have LIVING WILL patient does have HCPOA. Husband is HCPOA: FULL CODE.    Family History  Problem Relation Age of Onset  . Diabetes Mother   . Cancer Father     lung  . Hypertension Sister   . Heart disease Maternal Uncle   . Heart murmur Sister     Objective:   Physical Exam  Nursing note and vitals reviewed. Constitutional: She is oriented to person, place, and time. She appears well-developed and well-nourished. No distress.  HENT:  Head: Normocephalic and atraumatic.  Eyes: Conjunctivae normal are normal. Pupils are equal, round, and reactive to light.  Abdominal: Soft. Bowel sounds are normal. She exhibits no distension and no mass. There is tenderness in the suprapubic area. There is no rebound and no guarding.  Genitourinary: No labial fusion. There is no rash or lesion on the right labia. There is no rash or lesion on the left labia. There is bleeding around the vagina. No erythema around the vagina. No foreign body around the vagina. No signs of injury around the vagina. Vaginal discharge found.       SCANT VAGINAL BLEEDING LATERAL WALLS R>L.  SCANT YELLOW DISCHARGE.  Neurological: She is alert and oriented to person, place, and time.  Skin: Skin is warm and dry. She is not diaphoretic.  Psychiatric: She has a normal mood and affect. Her behavior is normal. Judgment and thought content normal.       Results  for orders placed in visit on 11/18/11  POCT UA - MICROSCOPIC ONLY      Component Value Range   WBC, Ur, HPF, POC 0-2     RBC, urine, microscopic 3-5     Bacteria, U Microscopic trace     Mucus, UA positive     Epithelial cells, urine per micros 0-1     Crystals, Ur, HPF, POC neg     Casts, Ur, LPF, POC neg     Yeast, UA neg    POCT URINALYSIS DIPSTICK      Component Value Range   Color, UA yellow     Clarity, UA clear     Glucose, UA neg     Bilirubin, UA  neg     Ketones, UA neg     Spec Grav, UA 1.015     Blood, UA trace-intact     pH, UA 7.5     Protein, UA neg     Urobilinogen, UA 0.2     Nitrite, UA neg     Leukocytes, UA Trace    POCT WET PREP WITH KOH      Component Value Range   Trichomonas, UA Negative     Clue Cells Wet Prep HPF POC 100%     Epithelial Wet Prep HPF POC 3-5     Yeast Wet Prep HPF POC neg     Bacteria Wet Prep HPF POC 2+     RBC Wet Prep HPF POC 10-13     WBC Wet Prep HPF POC 15-18     KOH Prep POC Negative      Assessment & Plan:   1. Vaginal bleeding  POCT UA - Microscopic Only, POCT urinalysis dipstick, POCT Wet Prep with KOH, Pap IG (Image Guided)  2. Vaginal Discharge  POCT Wet Prep with KOH  3. Bacterial vaginosis  metroNIDAZOLE (METROGEL VAGINAL) 0.75 % vaginal gel  4. Itching  triamcinolone cream (KENALOG) 0.1 %  5. Need for prophylactic vaccination and inoculation against influenza  Flu vaccine greater than or equal to 3yo preservative free IM     1. Vaginal Bleeding: New.  Onset one week ago; s/p hysterectomy due to fibroids/DUB.  Pap smear obtained.  Treat BV and reevaluate.  Atrophic vaginitis may also be contributing yet reluctant to use estrogen cream due to Factor V mutation.  Continue to hold Oxybutynin due to drying effect.  If vaginal bleeding persists, will refer to gyn to rule out pathology. 2.  BV:  New.  Treat with Metrogel bid x 5 days. 3.  S/p influenza vaccine.   4.  Pruritis Neck: chronic issue.  Agreeable to refill  Triamcinolone yet encourage decreased use due to thinning effect on skin.  Patient to substitute with Eucerin cream.

## 2011-11-20 ENCOUNTER — Telehealth: Payer: Self-pay

## 2011-11-20 LAB — PAP IG (IMAGE GUIDED)

## 2011-11-20 NOTE — Telephone Encounter (Signed)
Would wait for 1-2 weeks before referral needs to be made.  Should complete prescription and then wait another week to see if treatment improved symptoms.  She has an upcoming appointment in 2 weeks; if still having bleeding at that time, will make referral to GYN.  KMS

## 2011-11-20 NOTE — Telephone Encounter (Signed)
PT SAW DR Katrinka Blazing ABOUT VAGINAL BLEEDING.  SHE STILL IS BLEEDING, BUT NOT HEAVY.  THEY HAD TALKED ABOUT HER BEING REFERRED IF THIS CONTINUED.  SHE WANTS TO KNOW HOW LONG SHE SHOULD WAIT BEFORE A REFERRAL SHOULD BE MADE.  [phone]

## 2011-11-21 NOTE — Telephone Encounter (Signed)
Pt notified and will wait until appt.

## 2011-11-22 NOTE — Progress Notes (Signed)
Reviewed and agree.

## 2011-11-24 DIAGNOSIS — K2970 Gastritis, unspecified, without bleeding: Secondary | ICD-10-CM

## 2011-11-24 DIAGNOSIS — K5521 Angiodysplasia of colon with hemorrhage: Secondary | ICD-10-CM

## 2011-11-24 DIAGNOSIS — K31819 Angiodysplasia of stomach and duodenum without bleeding: Secondary | ICD-10-CM

## 2011-11-24 HISTORY — DX: Gastritis, unspecified, without bleeding: K29.70

## 2011-11-24 HISTORY — DX: Angiodysplasia of stomach and duodenum without bleeding: K31.819

## 2011-11-24 HISTORY — DX: Angiodysplasia of colon with hemorrhage: K55.21

## 2011-12-01 DIAGNOSIS — K31819 Angiodysplasia of stomach and duodenum without bleeding: Secondary | ICD-10-CM | POA: Diagnosis not present

## 2011-12-01 DIAGNOSIS — K5730 Diverticulosis of large intestine without perforation or abscess without bleeding: Secondary | ICD-10-CM | POA: Diagnosis not present

## 2011-12-01 DIAGNOSIS — K5521 Angiodysplasia of colon with hemorrhage: Secondary | ICD-10-CM | POA: Diagnosis not present

## 2011-12-01 DIAGNOSIS — Z86010 Personal history of colonic polyps: Secondary | ICD-10-CM | POA: Diagnosis not present

## 2011-12-01 DIAGNOSIS — K921 Melena: Secondary | ICD-10-CM | POA: Diagnosis not present

## 2011-12-01 DIAGNOSIS — K219 Gastro-esophageal reflux disease without esophagitis: Secondary | ICD-10-CM | POA: Diagnosis not present

## 2011-12-01 DIAGNOSIS — D509 Iron deficiency anemia, unspecified: Secondary | ICD-10-CM | POA: Diagnosis not present

## 2011-12-01 DIAGNOSIS — K648 Other hemorrhoids: Secondary | ICD-10-CM | POA: Diagnosis not present

## 2011-12-01 HISTORY — PX: ESOPHAGOGASTRODUODENOSCOPY: SHX1529

## 2011-12-04 NOTE — Progress Notes (Signed)
Reviewed and agree.

## 2011-12-04 NOTE — Progress Notes (Deleted)
  Subjective:    Patient ID: Faith Thompson, female    DOB: 04-22-1945, 66 y.o.   MRN: [id number]  HPI    Review of Systems     Objective:   Physical Exam        Assessment & Plan:

## 2011-12-08 ENCOUNTER — Encounter: Payer: Self-pay | Admitting: Family Medicine

## 2011-12-08 ENCOUNTER — Ambulatory Visit (INDEPENDENT_AMBULATORY_CARE_PROVIDER_SITE_OTHER): Payer: Medicare Other | Admitting: Family Medicine

## 2011-12-08 VITALS — BP 128/80 | HR 74 | Temp 98.1°F | Resp 16 | Ht 62.0 in | Wt 237.2 lb

## 2011-12-08 DIAGNOSIS — D509 Iron deficiency anemia, unspecified: Secondary | ICD-10-CM

## 2011-12-08 DIAGNOSIS — K5521 Angiodysplasia of colon with hemorrhage: Secondary | ICD-10-CM

## 2011-12-08 DIAGNOSIS — F3289 Other specified depressive episodes: Secondary | ICD-10-CM | POA: Diagnosis not present

## 2011-12-08 DIAGNOSIS — F32A Depression, unspecified: Secondary | ICD-10-CM

## 2011-12-08 DIAGNOSIS — K2970 Gastritis, unspecified, without bleeding: Secondary | ICD-10-CM | POA: Diagnosis not present

## 2011-12-08 DIAGNOSIS — Q2733 Arteriovenous malformation of digestive system vessel: Secondary | ICD-10-CM | POA: Diagnosis not present

## 2011-12-08 DIAGNOSIS — G4700 Insomnia, unspecified: Secondary | ICD-10-CM

## 2011-12-08 DIAGNOSIS — F329 Major depressive disorder, single episode, unspecified: Secondary | ICD-10-CM | POA: Diagnosis not present

## 2011-12-08 DIAGNOSIS — N3281 Overactive bladder: Secondary | ICD-10-CM

## 2011-12-08 DIAGNOSIS — K2990 Gastroduodenitis, unspecified, without bleeding: Secondary | ICD-10-CM

## 2011-12-08 DIAGNOSIS — K31819 Angiodysplasia of stomach and duodenum without bleeding: Secondary | ICD-10-CM

## 2011-12-08 LAB — CBC WITH DIFFERENTIAL/PLATELET
Hemoglobin: 11.6 g/dL — ABNORMAL LOW (ref 12.0–15.0)
Lymphocytes Relative: 31 % (ref 12–46)
Lymphs Abs: 2.1 10*3/uL (ref 0.7–4.0)
Monocytes Relative: 6 % (ref 3–12)
Neutro Abs: 4 10*3/uL (ref 1.7–7.7)
Neutrophils Relative %: 59 % (ref 43–77)
RBC: 4.58 MIL/uL (ref 3.87–5.11)

## 2011-12-08 MED ORDER — CITALOPRAM HYDROBROMIDE 20 MG PO TABS: ORAL_TABLET | ORAL | Status: DC

## 2011-12-08 NOTE — Progress Notes (Signed)
[address]   [place], [place]  [address]   [phone]  Subjective:    Patient ID: Faith Thompson, female    DOB: 1946/01/26, 66 y.o.   MRN: [id number]  HPIThis 66 y.o. female presents for evaluation of the following:  1.  Anemia:  S/p EGD which revealed diffuse gastritis, multiple non-bleeding AVMs.  Colonoscopy that revealed bleeding AVM s/p cauterization, internal hemorrhoids.  Going for ultrasound tomorrow regarding pulsatile mass.  One week ago.  Stopped iron one week before EGD/colonoscopy.  2.  Vaginal bleeding: improved with Metrogel cream.  Had bleeding the third day; by time finished medication, bleeding gone. Stopped Oxybutynin.    3. Overactive bladder: having to wear poise pad; +urgency.   +dryness, itching on medication; stopped Oxybutynin.   4.  Depression/Anxiety: angry at times.  Otherwise stable. Compliance with medication.    5. Insomnia:  Taking Tylenol PM one at night.  Helps 50% of time.  Not napping as much during the day.  Improved energy from last visit.    6. Macular degeneration:  S/p eye exam has worsened but may be due to anemia.  To follow up in three months.      Review of Systems  Constitutional: Negative for fever, chills, diaphoresis and fatigue.  Gastrointestinal: Negative for nausea, vomiting, abdominal pain, diarrhea, constipation, blood in stool, abdominal distention, anal bleeding and rectal pain.  Genitourinary: Positive for frequency. Negative for vaginal bleeding, vaginal discharge and vaginal pain.  Hematological: Negative for adenopathy. Does not bruise/bleed easily.  Psychiatric/Behavioral: Positive for sleep disturbance. Negative for suicidal ideas, dysphoric mood and decreased concentration. The patient is nervous/anxious.     Past Medical History  Diagnosis Date  . Anemia   . Asthma   . Depression   . Ulcer   . Overactive bladder   . Allergic rhinitis, cause unspecified   . GERD (gastroesophageal reflux disease)   . Factor V  Leiden mutation   . Obesity, unspecified   . Dysthymic disorder   . Macular degeneration (senile) of retina, unspecified     Past Surgical History  Procedure Date  . Tonsillectomy age 45  . Cholecystectomy 1993  . Abdominal hysterectomy 1996    fibroids ovaries removed    Prior to Admission medications   Medication Sig Start Date End Date Taking? Authorizing Provider  benzonatate (TESSALON) 100 MG capsule Take 100 mg by mouth 3 (three) times daily as needed.   Yes Historical Provider, MD  budesonide-formoterol (SYMBICORT) 160-4.5 MCG/ACT inhaler Inhale 1 puff into the lungs 2 (two) times daily.   Yes Historical Provider, MD  calcium carbonate (OS-CAL) 600 MG TABS Take 600 mg by mouth 2 (two) times daily with a meal.   Yes Historical Provider, MD  cetirizine (ZYRTEC) 10 MG tablet Take 10 mg by mouth daily.   Yes Historical Provider, MD  citalopram (CELEXA) 20 MG tablet 1.5 tablets daily 12/08/11  Yes Ethelda Chick, MD  ferrous sulfate 325 (65 FE) MG tablet Take 325 mg by mouth daily with breakfast.   Yes Historical Provider, MD  fish oil-omega-3 fatty acids 1000 MG capsule Take 2 g by mouth daily.   Yes Historical Provider, MD  Lutein 6 MG CAPS Take by mouth daily.   Yes Historical Provider, MD  omeprazole (PRILOSEC) 20 MG capsule TAKE ONE CAPSULE BY MOUTH DAILY 10/24/11  Yes Morrell Riddle, PA-C  sucralfate (CARAFATE) 1 G tablet Take 1 g by mouth 2 (two) times daily.   Yes Historical  Provider, MD  triamcinolone cream (KENALOG) 0.1 % Apply topically 2 (two) times daily as needed. 11/18/11  Yes Ethelda Chick, MD  metroNIDAZOLE (METROGEL VAGINAL) 0.75 % vaginal gel Place 1 Applicatorful vaginally 2 (two) times daily. 11/18/11   Ethelda Chick, MD  naproxen sodium (ANAPROX) 220 MG tablet Take 220 mg by mouth 2 (two) times daily with a meal.    Historical Provider, MD  oxybutynin (DITROPAN-XL) 10 MG 24 hr tablet Take 10 mg by mouth daily.    Historical Provider, MD    Allergies  Allergen  Reactions  . Codeine Itching    History   Social History  . Marital Status: Married    Spouse Name: N/A    Number of Children: 3  . Years of Education: N/A   Occupational History  . Homemaker    Social History Main Topics  . Smoking status: Never Smoker   . Smokeless tobacco: Not on file  . Alcohol Use: Yes     Comment: occasional very rarely social events  . Drug Use: No  . Sexually Active: Yes   Other Topics Concern  . Not on file   Social History Narrative   Smoke alarm and carbon monoxide detector in the home.No guns in the home.Always uses seat belts. Married x 45 years Happily no abuse. Caffeine use: Coffee 2 servings per day. 4 grandchildren. Exercise: Moderate, Walking 30 minutes daily on treadmill. Patient does have LIVING WILL patient does have HCPOA. Husband is HCPOA: FULL CODE.    Family History  Problem Relation Age of Onset  . Diabetes Mother   . Cancer Father     lung  . Hypertension Sister   . Heart disease Maternal Uncle   . Heart murmur Sister         Objective:   Physical Exam  Nursing note and vitals reviewed. Constitutional: She is oriented to person, place, and time. She appears well-developed and well-nourished. No distress.  HENT:  Mouth/Throat: Oropharynx is clear and moist.  Eyes: Conjunctivae normal and EOM are normal. Pupils are equal, round, and reactive to light.  Neck: Normal range of motion. Neck supple. No thyromegaly present.  Cardiovascular: Normal rate, regular rhythm and normal heart sounds.  Exam reveals no gallop and no friction rub.   No murmur heard. Pulmonary/Chest: Effort normal and breath sounds normal.  Abdominal: Soft. Bowel sounds are normal. She exhibits no distension. There is no tenderness. There is no rebound and no guarding.  Lymphadenopathy:    She has no cervical adenopathy.  Neurological: She is alert and oriented to person, place, and time. No cranial nerve deficit. She exhibits normal muscle tone.  Coordination normal.  Skin: She is not diaphoretic.  Psychiatric: She has a normal mood and affect. Her behavior is normal. Judgment and thought content normal.       Assessment & Plan:   1. Iron deficiency anemia, unspecified  CBC with Differential  2. Depression  citalopram (CELEXA) 20 MG tablet  3. Gastritis    4. AVM (arteriovenous malformation) of colon with hemorrhage    5. AVM (arteriovenous malformation) of stomach, acquired    6. Insomnia    7. Vaginal bleeding   1.  Iron deficiency Anemia: Improving; obtain labs. Continue current iron supplementation. 2.  Depression:  Worsening; increase Citalopram to 20mg   1.5 tablets daily for total of 30mg  daily. 3.  Insomnia: improved with Tylenol PM. 4.  AVM colon with hemorrhage:  New.  S/p cauterization with colonoscopy; likely  source of GI bleeding and blood loss/anemia.  5.  Gastritis:  New.  S/p EGD that revealed gastritis, AVM non-bleeding, pulsaltile mass; going for vascular study this week. 6.  AVM of stomach: New.  Non-bleeding on EGD. 7. Pulsaltile mass of stomach: New.  Going for vascular imaging per GI this week.   8. Vaginal bleeding: resolved with Metrogel.   9. Overactive Bladder:  Uncontrolled/worsening with cessation of Oxybutynin.  Consider urology referral in future to evaluate for other treatment options.   Meds ordered this encounter  Medications  . sucralfate (CARAFATE) 1 G tablet    Sig: Take 1 g by mouth 2 (two) times daily.  . citalopram (CELEXA) 20 MG tablet    Sig: 1.5 tablets daily    Dispense:  45 tablet    Refill:  11

## 2011-12-08 NOTE — Patient Instructions (Addendum)
1. Iron deficiency anemia, unspecified  CBC with Differential  2. Depression  citalopram (CELEXA) 20 MG tablet

## 2011-12-09 DIAGNOSIS — K31819 Angiodysplasia of stomach and duodenum without bleeding: Secondary | ICD-10-CM | POA: Diagnosis not present

## 2011-12-09 DIAGNOSIS — Z9089 Acquired absence of other organs: Secondary | ICD-10-CM | POA: Diagnosis not present

## 2011-12-09 DIAGNOSIS — D649 Anemia, unspecified: Secondary | ICD-10-CM | POA: Diagnosis not present

## 2011-12-09 DIAGNOSIS — I7789 Other specified disorders of arteries and arterioles: Secondary | ICD-10-CM | POA: Diagnosis not present

## 2011-12-13 ENCOUNTER — Telehealth: Payer: Self-pay

## 2011-12-13 NOTE — Telephone Encounter (Signed)
Patient calling to get lab results from OV on 10/15. She can be reached at [phone].

## 2011-12-14 ENCOUNTER — Telehealth: Payer: Self-pay | Admitting: Family Medicine

## 2011-12-14 NOTE — Telephone Encounter (Signed)
Patient already got results from lab earlier this morning

## 2012-01-05 ENCOUNTER — Encounter: Payer: Self-pay | Admitting: Family Medicine

## 2012-02-08 ENCOUNTER — Ambulatory Visit: Payer: Medicare Other | Admitting: Family Medicine

## 2012-02-09 ENCOUNTER — Encounter: Payer: Self-pay | Admitting: Family Medicine

## 2012-02-09 DIAGNOSIS — F32A Depression, unspecified: Secondary | ICD-10-CM | POA: Insufficient documentation

## 2012-02-09 DIAGNOSIS — K31819 Angiodysplasia of stomach and duodenum without bleeding: Secondary | ICD-10-CM | POA: Insufficient documentation

## 2012-02-09 DIAGNOSIS — K2970 Gastritis, unspecified, without bleeding: Secondary | ICD-10-CM | POA: Insufficient documentation

## 2012-02-09 DIAGNOSIS — K5521 Angiodysplasia of colon with hemorrhage: Secondary | ICD-10-CM | POA: Insufficient documentation

## 2012-02-09 DIAGNOSIS — N3281 Overactive bladder: Secondary | ICD-10-CM | POA: Insufficient documentation

## 2012-02-09 DIAGNOSIS — F329 Major depressive disorder, single episode, unspecified: Secondary | ICD-10-CM | POA: Insufficient documentation

## 2012-02-09 DIAGNOSIS — G4700 Insomnia, unspecified: Secondary | ICD-10-CM | POA: Insufficient documentation

## 2012-02-09 DIAGNOSIS — D509 Iron deficiency anemia, unspecified: Secondary | ICD-10-CM | POA: Insufficient documentation

## 2012-02-19 NOTE — Progress Notes (Signed)
Reviewed and agree.

## 2012-02-25 DIAGNOSIS — D649 Anemia, unspecified: Secondary | ICD-10-CM | POA: Diagnosis not present

## 2012-02-25 DIAGNOSIS — K2970 Gastritis, unspecified, without bleeding: Secondary | ICD-10-CM | POA: Diagnosis not present

## 2012-02-25 DIAGNOSIS — K219 Gastro-esophageal reflux disease without esophagitis: Secondary | ICD-10-CM | POA: Diagnosis not present

## 2012-02-25 DIAGNOSIS — D509 Iron deficiency anemia, unspecified: Secondary | ICD-10-CM | POA: Diagnosis not present

## 2012-02-25 DIAGNOSIS — K2960 Other gastritis without bleeding: Secondary | ICD-10-CM | POA: Diagnosis not present

## 2012-02-29 ENCOUNTER — Encounter: Payer: Self-pay | Admitting: Family Medicine

## 2012-02-29 ENCOUNTER — Ambulatory Visit (INDEPENDENT_AMBULATORY_CARE_PROVIDER_SITE_OTHER): Payer: Medicare Other | Admitting: Family Medicine

## 2012-02-29 VITALS — BP 118/70 | HR 76 | Temp 99.0°F | Resp 16 | Ht 62.0 in | Wt 241.6 lb

## 2012-02-29 DIAGNOSIS — R0989 Other specified symptoms and signs involving the circulatory and respiratory systems: Secondary | ICD-10-CM

## 2012-02-29 DIAGNOSIS — F329 Major depressive disorder, single episode, unspecified: Secondary | ICD-10-CM | POA: Diagnosis not present

## 2012-02-29 DIAGNOSIS — R32 Unspecified urinary incontinence: Secondary | ICD-10-CM | POA: Insufficient documentation

## 2012-02-29 DIAGNOSIS — D509 Iron deficiency anemia, unspecified: Secondary | ICD-10-CM | POA: Diagnosis not present

## 2012-02-29 DIAGNOSIS — F3289 Other specified depressive episodes: Secondary | ICD-10-CM | POA: Diagnosis not present

## 2012-02-29 DIAGNOSIS — R0683 Snoring: Secondary | ICD-10-CM | POA: Insufficient documentation

## 2012-02-29 DIAGNOSIS — R0609 Other forms of dyspnea: Secondary | ICD-10-CM

## 2012-02-29 DIAGNOSIS — K31819 Angiodysplasia of stomach and duodenum without bleeding: Secondary | ICD-10-CM

## 2012-02-29 DIAGNOSIS — G4710 Hypersomnia, unspecified: Secondary | ICD-10-CM

## 2012-02-29 DIAGNOSIS — F32A Depression, unspecified: Secondary | ICD-10-CM

## 2012-02-29 DIAGNOSIS — K2970 Gastritis, unspecified, without bleeding: Secondary | ICD-10-CM

## 2012-02-29 DIAGNOSIS — K5521 Angiodysplasia of colon with hemorrhage: Secondary | ICD-10-CM

## 2012-02-29 LAB — POCT CBC
Hemoglobin: 13.5 g/dL (ref 12.2–16.2)
Lymph, poc: 2.6 (ref 0.6–3.4)
MCH, POC: 28.1 pg (ref 27–31.2)
MCHC: 31.8 g/dL (ref 31.8–35.4)
MCV: 88.1 fL (ref 80–97)
MID (cbc): 0.4 (ref 0–0.9)
MPV: 9.2 fL (ref 0–99.8)
POC MID %: 7.4 %M (ref 0–12)
RBC: 4.81 M/uL (ref 4.04–5.48)
WBC: 5.3 10*3/uL (ref 4.6–10.2)

## 2012-02-29 LAB — IRON AND TIBC
TIBC: 331 ug/dL (ref 250–470)
UIBC: 263 ug/dL (ref 125–400)

## 2012-02-29 LAB — FERRITIN: Ferritin: 35 ng/mL (ref 10–291)

## 2012-02-29 MED ORDER — SOLIFENACIN SUCCINATE 5 MG PO TABS: 5.0000 mg | ORAL_TABLET | Freq: Every day | ORAL | Status: DC

## 2012-02-29 NOTE — Progress Notes (Signed)
[address]   [place], [place]  [address]   [phone]  Subjective:    Patient ID: Faith Thompson, female    DOB: 16-Aug-1945, 67 y.o.   MRN: [id number]  HPIThis 67 y.o. female presents for evaluation of the following:  1.  Anemia iron deficiency:  Three month follow-up; +Compliance with ferrous sulfate daily; due for repeat labs. S/p follow-up with GI physician; released from care.  Denies n/v/d/c; denies bloody stools or melena; normal appetite; no abdominal pain.  2.  Pulsatile gastric mass: s/p ultrasound vascular with poor imaging; no further imaging warranted at this time.  No symptoms.  3. Depression:  Three month follow-up; management changes at last visit include increasing Citalopram to 20mg    1.5 tablets daily; emotionally doing well. Had a wonderful holiday season with family; able to spend Christmas with all children and grandchildren.  Worried about brother-in-law who has cirrhosis.  Needs to travel to see family but dreading it.  No SI/HI.  4.  Gastritis and gastric AVM:GI physician prescribed Carafate  1 gram twice daily before meals (breakfast and dinner); continue Omeprazole two daily until 03/26/12. Released for five years.  Denies abdominal pain, nausea, vomiting, anorexia, melena.  5.  Urinary incontinence; urge incontinence:  Stopped oxybutinin four months ago; suffering with frequent urinary leakage with urge to urinate.  Changing pad 3-4 times per day; does not want to smell.  Requesting trial of different medication.  No previous urology evaluation.    6.  Snoring:  Has worsened.  Sleeps separately from husband due to snoring.  No nasal congestion.  No inhaler use.  No previous sleep study.  +daytime fatigue.  Still using sleeping pill.  In bed at 11:00pm; must read for 30 minutes. Takes pill before reading.  In bed by midnight and awakens by 7:30am.  Will fall asleep in morning while watching television and drinking a.m. Coffee.   Review of Systems  Constitutional:  Positive for fatigue. Negative for fever, chills and diaphoresis.  Respiratory: Negative for apnea and shortness of breath.   Cardiovascular: Negative for chest pain, palpitations and leg swelling.  Gastrointestinal: Negative for nausea, vomiting, abdominal pain, diarrhea, constipation, blood in stool, abdominal distention, anal bleeding and rectal pain.  Genitourinary: Positive for urgency and frequency. Negative for dysuria, vaginal bleeding and vaginal discharge.  Psychiatric/Behavioral: Positive for sleep disturbance and dysphoric mood. Negative for suicidal ideas and self-injury. The patient is not nervous/anxious.     Past Medical History  Diagnosis Date  . Anemia   . Asthma   . Depression   . Ulcer   . Overactive bladder   . Allergic rhinitis, cause unspecified   . GERD (gastroesophageal reflux disease)   . Factor V Leiden mutation   . Obesity, unspecified   . Dysthymic disorder   . Macular degeneration (senile) of retina, unspecified   . Gastritis 11/24/2011    EGD: +gastritis; AVM gastric region, pulsatile mass in stomach  . 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  . 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    Past Surgical History  Procedure Date  . Tonsillectomy age 48  . Cholecystectomy 1993  . Abdominal hysterectomy 1996    fibroids ovaries removed  . Colonoscopy 10/2011    AVM bleeding in colon s/p cauterization.  Symptoms: anemia, hemoccult +.  . Esophagogastroduodenoscopy 10/2011    gastritis, AVM  of stomach non-bleeding, pulsatile mass of stomach.  Symptoms: iron defficiency anemia with hemocult +.    Prior to Admission medications   Medication Sig Start Date End Date Taking? Authorizing Provider  benzonatate (TESSALON) 100 MG capsule Take 100 mg by mouth 3 (three) times daily as needed.   Yes Historical Provider, MD    budesonide-formoterol (SYMBICORT) 160-4.5 MCG/ACT inhaler Inhale 1 puff into the lungs 2 (two) times daily.   Yes Historical Provider, MD  calcium carbonate (OS-CAL) 600 MG TABS Take 600 mg by mouth 2 (two) times daily with a meal.   Yes Historical Provider, MD  cetirizine (ZYRTEC) 10 MG tablet Take 10 mg by mouth daily.   Yes Historical Provider, MD  citalopram (CELEXA) 20 MG tablet 1.5 tablets daily 12/08/11  Yes Ethelda Chick, MD  ferrous sulfate 325 (65 FE) MG tablet Take 325 mg by mouth daily with breakfast.   Yes Historical Provider, MD  fish oil-omega-3 fatty acids 1000 MG capsule Take 2 g by mouth daily.   Yes Historical Provider, MD  Lutein 6 MG CAPS Take by mouth daily.   Yes Historical Provider, MD  omeprazole (PRILOSEC) 20 MG capsule TAKE ONE CAPSULE BY MOUTH DAILY 10/24/11  Yes Morrell Riddle, PA-C  sucralfate (CARAFATE) 1 G tablet Take 1 g by mouth 2 (two) times daily.   Yes Historical Provider, MD  solifenacin (VESICARE) 5 MG tablet Take 1 tablet (5 mg total) by mouth daily. 02/29/12   Ethelda Chick, MD  triamcinolone cream (KENALOG) 0.1 % Apply topically 2 (two) times daily as needed. 11/18/11   Ethelda Chick, MD    Allergies  Allergen Reactions  . Codeine Itching    History   Social History  . Marital Status: Married    Spouse Name: N/A    Number of Children: 3  . Years of Education: N/A   Occupational History  . Homemaker    Social History Main Topics  . Smoking status: Never Smoker   . Smokeless tobacco: Not on file  . Alcohol Use: Yes     Comment: occasional very rarely social events  . Drug Use: No  . Sexually Active: Yes   Other Topics Concern  . Not on file   Social History Narrative   Smoke alarm and carbon monoxide detector in the home.No guns in the home.Always uses seat belts. Married x 45 years Happily no abuse. Caffeine use: Coffee 2 servings per day. 4 grandchildren. Exercise: Moderate, Walking 30 minutes daily on treadmill. Patient does have  LIVING WILL patient does have HCPOA. Husband is HCPOA: FULL CODE.    Family History  Problem Relation Age of Onset  . Diabetes Mother   . Cancer Father     lung  . Hypertension Sister   . Heart disease Maternal Uncle   . Heart murmur Sister        Objective:   Physical Exam  Nursing note and vitals reviewed. Constitutional: She is oriented to person, place, and time. She appears well-developed and well-nourished. No distress.  Eyes: Conjunctivae normal and EOM are normal. Pupils are equal, round, and reactive to light.  Neck: Normal range of motion. Neck supple. No thyromegaly present.  Cardiovascular: Normal rate, regular rhythm, normal heart sounds and intact distal pulses.   Pulmonary/Chest: Effort normal and breath sounds normal.  Abdominal: Soft. Bowel sounds are normal. She exhibits no distension and no mass. There is no tenderness. There is no rebound and no guarding.  Lymphadenopathy:  She has no cervical adenopathy.  Neurological: She is alert and oriented to person, place, and time. She has normal reflexes. No cranial nerve deficit. She exhibits normal muscle tone. Coordination normal.  Skin: She is not diaphoretic.  Psychiatric: She has a normal mood and affect. Her behavior is normal. Judgment and thought content normal.      Results for orders placed in visit on 02/29/12  POCT CBC      Component Value Range   WBC 5.3  4.6 - 10.2 K/uL   Lymph, poc 2.6  0.6 - 3.4   POC LYMPH PERCENT 48.7  10 - 50 %L   MID (cbc) 0.4  0 - 0.9   POC MID % 7.4  0 - 12 %M   POC Granulocyte 2.3  2 - 6.9   Granulocyte percent 43.9  37 - 80 %G   RBC 4.81  4.04 - 5.48 M/uL   Hemoglobin 13.5  12.2 - 16.2 g/dL   HCT, POC 16.1  09.6 - 47.9 %   MCV 88.1  80 - 97 fL   MCH, POC 28.1  27 - 31.2 pg   MCHC 31.8  31.8 - 35.4 g/dL   RDW, POC 04.5     Platelet Count, POC 322  142 - 424 K/uL   MPV 9.2  0 - 99.8 fL    Assessment & Plan:   1. Iron deficiency anemia, unspecified  Iron and  TIBC, Ferritin, POCT CBC  2. Urinary incontinence    3. Snoring  Split night study  4. Hypersomnolence  Split night study  5. Depression

## 2012-02-29 NOTE — Assessment & Plan Note (Signed)
Persistent despite improvement in nighttime sleep; refer for sleep study.

## 2012-02-29 NOTE — Assessment & Plan Note (Signed)
Improved/resolved; stop OTC ferrous sulfate.  Asymptomatic.

## 2012-02-29 NOTE — Patient Instructions (Addendum)
1. Iron deficiency anemia, unspecified  Iron and TIBC, Ferritin, POCT CBC  2. Urinary incontinence    3. Snoring  Split night study  4. Hypersomnolence  Split night study  5. Depression

## 2012-02-29 NOTE — Assessment & Plan Note (Addendum)
Uncontrolled.  Rx for Vesicare 5mg  daily.  Consider referral to urology if suffers with significant side effects to Vesicare.

## 2012-02-29 NOTE — Assessment & Plan Note (Signed)
Improving; continue Carafate bid and Omeprazole.

## 2012-02-29 NOTE — Assessment & Plan Note (Signed)
Improving; tolerating Citalopram 30mg  daily; no changes to management made.

## 2012-02-29 NOTE — Assessment & Plan Note (Signed)
Worsening; associated with daytime hypersomnolence, insomnia, obesity.  Refer for sleep study.

## 2012-03-01 ENCOUNTER — Telehealth: Payer: Self-pay

## 2012-03-01 NOTE — Telephone Encounter (Signed)
Called pt, LMOM to CB with details.

## 2012-03-01 NOTE — Telephone Encounter (Signed)
PT STATES THE MEDICINE SHE WAS GIVEN COST OVER $200.00. PLEASE CALL [phone]

## 2012-03-02 NOTE — Telephone Encounter (Signed)
Patient advised.

## 2012-03-02 NOTE — Telephone Encounter (Signed)
Yes, if patient has a lot of Oxybutynin left over and would like to try 1/2 tablet, I am agreeable.  KMS

## 2012-03-02 NOTE — Telephone Encounter (Signed)
Vesicare is not covered because she has not med deductible. She has left over Rx for Oxybutnin 10mg  wants to know if she can take 1/2 of this, she has a lot of these on hand but you had d/c because it dryed her out, she wants to know if she can try the 1/2 dose.

## 2012-03-17 ENCOUNTER — Ambulatory Visit: Payer: Self-pay | Admitting: Family Medicine

## 2012-03-17 DIAGNOSIS — R0609 Other forms of dyspnea: Secondary | ICD-10-CM | POA: Diagnosis not present

## 2012-03-17 DIAGNOSIS — G478 Other sleep disorders: Secondary | ICD-10-CM | POA: Diagnosis not present

## 2012-03-17 DIAGNOSIS — G4733 Obstructive sleep apnea (adult) (pediatric): Secondary | ICD-10-CM | POA: Diagnosis not present

## 2012-03-17 DIAGNOSIS — F489 Nonpsychotic mental disorder, unspecified: Secondary | ICD-10-CM | POA: Diagnosis not present

## 2012-03-17 DIAGNOSIS — G2589 Other specified extrapyramidal and movement disorders: Secondary | ICD-10-CM | POA: Diagnosis not present

## 2012-03-17 DIAGNOSIS — G2581 Restless legs syndrome: Secondary | ICD-10-CM | POA: Diagnosis not present

## 2012-03-21 DIAGNOSIS — M898X9 Other specified disorders of bone, unspecified site: Secondary | ICD-10-CM | POA: Diagnosis not present

## 2012-03-21 DIAGNOSIS — M24573 Contracture, unspecified ankle: Secondary | ICD-10-CM | POA: Diagnosis not present

## 2012-03-31 ENCOUNTER — Encounter: Payer: Self-pay | Admitting: *Deleted

## 2012-04-05 ENCOUNTER — Telehealth: Payer: Self-pay

## 2012-04-05 DIAGNOSIS — F32A Depression, unspecified: Secondary | ICD-10-CM

## 2012-04-05 DIAGNOSIS — F329 Major depressive disorder, single episode, unspecified: Secondary | ICD-10-CM

## 2012-04-05 NOTE — Telephone Encounter (Signed)
Dr Katrinka Blazing    Patient is taking 1 1/2 pills per day, her prescription is only for one a day and the pharmacy will not refill until they have a new script.  [facility] [address] in [place]   citalopram (CELEXA) 20 MG tablet   [phone]

## 2012-04-05 NOTE — Telephone Encounter (Signed)
Is it okay to increase to 1 and 1/2 pills daily?

## 2012-04-07 NOTE — Telephone Encounter (Signed)
Dr. Katrinka Blazing increased the dose at the patient's visit in the fall and the OV last month says to continue Celexa 20 mg 1.5 tabs (30 mg) daily.Dr. Katrinka Blazing sent a new Rx that refelcts that on 12/08/2011.  If the pharmacy has something different, they're using the previous Rx and need to cancel that one and use the newer one.

## 2012-04-08 NOTE — Telephone Encounter (Signed)
Called pharmacy and was advised that they do have the new Rx written in Oct and pt paid OOP for #45 on 04/05/12. They had previously filled for #90 for 1 tab QD on 02/01/12. Pharmacist tried to do a test claim and was told that the dose exceeded plan limit and she will fax me a prior auth request w/pt info.

## 2012-04-12 NOTE — Progress Notes (Signed)
Completed prior auth for Citalopram 20, 1 1/2 tabs QD over the phone and received approval through 02/22/13. Faxed approval notice to pharmacy.

## 2012-04-12 NOTE — Telephone Encounter (Signed)
See notes under documentation encounter 04/12/12.

## 2012-04-14 ENCOUNTER — Telehealth: Payer: Self-pay

## 2012-04-14 NOTE — Telephone Encounter (Signed)
Patient had sleep study done and has never heard the results from this please call husband hector at [phone]

## 2012-04-14 NOTE — Telephone Encounter (Signed)
Was done at [facility], do you have the results?

## 2012-04-15 DIAGNOSIS — C4401 Basal cell carcinoma of skin of lip: Secondary | ICD-10-CM | POA: Diagnosis not present

## 2012-04-15 DIAGNOSIS — D1801 Hemangioma of skin and subcutaneous tissue: Secondary | ICD-10-CM | POA: Diagnosis not present

## 2012-04-15 DIAGNOSIS — D485 Neoplasm of uncertain behavior of skin: Secondary | ICD-10-CM | POA: Diagnosis not present

## 2012-04-15 NOTE — Telephone Encounter (Signed)
Please let me know when this comes in. Faith Thompson

## 2012-04-15 NOTE — Telephone Encounter (Signed)
I have not received the results of sleep study.  I do have a copy of the order placed for the sleep study in my box but I did not see the results of sleep study; I believe it was performed in [place]; this should be in the chart or referrals should know.  Please obtain sleep study results.

## 2012-04-15 NOTE — Telephone Encounter (Signed)
Left message to advise report not rc'd also called  sleep center 1 [phone] to get the report faxed.

## 2012-04-19 ENCOUNTER — Telehealth: Payer: Self-pay | Admitting: Radiology

## 2012-04-19 NOTE — Telephone Encounter (Signed)
I have the sleep study. Please review and advise. Put it in your box. Amy

## 2012-04-21 ENCOUNTER — Other Ambulatory Visit: Payer: Self-pay | Admitting: Family Medicine

## 2012-04-21 ENCOUNTER — Other Ambulatory Visit: Payer: Self-pay | Admitting: Radiology

## 2012-04-21 DIAGNOSIS — G4710 Hypersomnia, unspecified: Secondary | ICD-10-CM

## 2012-04-21 DIAGNOSIS — G4733 Obstructive sleep apnea (adult) (pediatric): Secondary | ICD-10-CM

## 2012-04-21 DIAGNOSIS — R0683 Snoring: Secondary | ICD-10-CM

## 2012-04-21 DIAGNOSIS — G4730 Sleep apnea, unspecified: Secondary | ICD-10-CM

## 2012-04-21 NOTE — Telephone Encounter (Signed)
The CPAP has been recommended for her, need titration. This is ordered. She had initial sleep study, but was unsure why split study was not done. She will go for 2nd portion of split study.

## 2012-04-21 NOTE — Telephone Encounter (Signed)
Sleep study reviewed; +mild OSA; refer for CPAP titration.

## 2012-04-26 NOTE — Telephone Encounter (Signed)
Not sure if sleep study came in. Tried calling sleep center but no answer. Unable to leave message.

## 2012-04-27 ENCOUNTER — Other Ambulatory Visit: Payer: Self-pay | Admitting: Family Medicine

## 2012-05-24 DIAGNOSIS — G4733 Obstructive sleep apnea (adult) (pediatric): Secondary | ICD-10-CM

## 2012-05-24 HISTORY — DX: Obstructive sleep apnea (adult) (pediatric): G47.33

## 2012-05-24 HISTORY — PX: OTHER SURGICAL HISTORY: SHX169

## 2012-05-26 ENCOUNTER — Ambulatory Visit: Payer: Self-pay | Admitting: Family Medicine

## 2012-05-26 DIAGNOSIS — F5105 Insomnia due to other mental disorder: Secondary | ICD-10-CM | POA: Diagnosis not present

## 2012-05-26 DIAGNOSIS — G2581 Restless legs syndrome: Secondary | ICD-10-CM | POA: Diagnosis not present

## 2012-05-26 DIAGNOSIS — G4733 Obstructive sleep apnea (adult) (pediatric): Secondary | ICD-10-CM | POA: Diagnosis not present

## 2012-05-26 DIAGNOSIS — G2589 Other specified extrapyramidal and movement disorders: Secondary | ICD-10-CM | POA: Diagnosis not present

## 2012-05-26 DIAGNOSIS — F489 Nonpsychotic mental disorder, unspecified: Secondary | ICD-10-CM | POA: Diagnosis not present

## 2012-06-09 DIAGNOSIS — L821 Other seborrheic keratosis: Secondary | ICD-10-CM | POA: Diagnosis not present

## 2012-06-09 DIAGNOSIS — C4491 Basal cell carcinoma of skin, unspecified: Secondary | ICD-10-CM

## 2012-06-09 DIAGNOSIS — C4401 Basal cell carcinoma of skin of lip: Secondary | ICD-10-CM | POA: Diagnosis not present

## 2012-06-09 HISTORY — DX: Basal cell carcinoma of skin, unspecified: C44.91

## 2012-06-14 DIAGNOSIS — H25019 Cortical age-related cataract, unspecified eye: Secondary | ICD-10-CM | POA: Diagnosis not present

## 2012-06-14 DIAGNOSIS — H3530 Unspecified macular degeneration: Secondary | ICD-10-CM | POA: Diagnosis not present

## 2012-06-14 DIAGNOSIS — H524 Presbyopia: Secondary | ICD-10-CM | POA: Diagnosis not present

## 2012-06-21 ENCOUNTER — Telehealth: Payer: Self-pay

## 2012-06-21 NOTE — Telephone Encounter (Signed)
Wants to talk with someone about sleep stuidy results and when she is getting her cpap machine

## 2012-06-29 ENCOUNTER — Telehealth: Payer: Self-pay

## 2012-06-29 DIAGNOSIS — G4733 Obstructive sleep apnea (adult) (pediatric): Secondary | ICD-10-CM

## 2012-06-29 NOTE — Telephone Encounter (Signed)
Sleep study reviewed; +mild OSA; refer for CPAP titration. This was ordered in Feb for her, unsure why it is not done yet. Have checked with referrals on this. Lupita Leash will check on the reports, she shows it was done 05/26/12 but we have no results yet, have you seen this?

## 2012-06-29 NOTE — Telephone Encounter (Signed)
Call pt --- just reviewed request for CPAP over the weekend; will fax over order form today for CPAP.  Please apologize in delay.

## 2012-06-29 NOTE — Telephone Encounter (Signed)
Patient would like Dr Katrinka Blazing to call her. She says she has called our office several times about a cpap she was supposed to get and nobody has gotten back to her about it.  Best [phone]

## 2012-06-30 NOTE — Telephone Encounter (Signed)
Thanks called her to advise. She wants to know where this was sent. Please advise.

## 2012-07-01 NOTE — Telephone Encounter (Signed)
I sent rx to sleep study facility where CPAP titration completed.  They faxed over the results and recommendations of sleep study.

## 2012-07-04 ENCOUNTER — Telehealth: Payer: Self-pay

## 2012-07-04 NOTE — Telephone Encounter (Signed)
Thanks, I called her to advise. Did not get answer

## 2012-07-04 NOTE — Telephone Encounter (Signed)
Medical Records: We referred this patient to get a CPAP machine @ [facility] and they are needing notes sent to the on DOS 02/29/12.   AttnMisty Stanley  [phone]

## 2012-07-04 NOTE — Telephone Encounter (Signed)
Called again, not sure who [facility] uses for their supplies. I advised her I usually use [facility], she will call [facility] and see if they have gotten the supply order, and if they can get this for her.

## 2012-07-05 NOTE — Telephone Encounter (Signed)
Office notes faxed thru Epic.

## 2012-07-25 ENCOUNTER — Encounter: Payer: Medicare Other | Admitting: Family Medicine

## 2012-08-04 ENCOUNTER — Encounter: Payer: Self-pay | Admitting: Family Medicine

## 2012-08-24 ENCOUNTER — Encounter: Payer: Self-pay | Admitting: Family Medicine

## 2012-08-24 ENCOUNTER — Ambulatory Visit (INDEPENDENT_AMBULATORY_CARE_PROVIDER_SITE_OTHER): Payer: Medicare Other | Admitting: Family Medicine

## 2012-08-24 VITALS — BP 120/60 | HR 74 | Temp 98.7°F | Resp 16 | Ht 62.0 in | Wt 256.6 lb

## 2012-08-24 DIAGNOSIS — G4733 Obstructive sleep apnea (adult) (pediatric): Secondary | ICD-10-CM | POA: Diagnosis not present

## 2012-08-24 DIAGNOSIS — D509 Iron deficiency anemia, unspecified: Secondary | ICD-10-CM

## 2012-08-24 DIAGNOSIS — Z1239 Encounter for other screening for malignant neoplasm of breast: Secondary | ICD-10-CM

## 2012-08-24 DIAGNOSIS — N318 Other neuromuscular dysfunction of bladder: Secondary | ICD-10-CM

## 2012-08-24 DIAGNOSIS — F329 Major depressive disorder, single episode, unspecified: Secondary | ICD-10-CM

## 2012-08-24 DIAGNOSIS — R7309 Other abnormal glucose: Secondary | ICD-10-CM

## 2012-08-24 DIAGNOSIS — R32 Unspecified urinary incontinence: Secondary | ICD-10-CM

## 2012-08-24 DIAGNOSIS — F32A Depression, unspecified: Secondary | ICD-10-CM

## 2012-08-24 DIAGNOSIS — N3281 Overactive bladder: Secondary | ICD-10-CM

## 2012-08-24 DIAGNOSIS — Z0000 Encounter for general adult medical examination without abnormal findings: Secondary | ICD-10-CM

## 2012-08-24 DIAGNOSIS — G4710 Hypersomnia, unspecified: Secondary | ICD-10-CM

## 2012-08-24 DIAGNOSIS — Z9989 Dependence on other enabling machines and devices: Secondary | ICD-10-CM

## 2012-08-24 LAB — CBC WITH DIFFERENTIAL/PLATELET
Basophils Absolute: 0 10*3/uL (ref 0.0–0.1)
Basophils Relative: 1 % (ref 0–1)
Eosinophils Absolute: 0.2 10*3/uL (ref 0.0–0.7)
Eosinophils Relative: 2 % (ref 0–5)
Lymphs Abs: 2.6 10*3/uL (ref 0.7–4.0)
MCH: 27.2 pg (ref 26.0–34.0)
Neutrophils Relative %: 58 % (ref 43–77)
Platelets: 275 10*3/uL (ref 150–400)
RBC: 4.63 MIL/uL (ref 3.87–5.11)
RDW: 15 % (ref 11.5–15.5)

## 2012-08-24 LAB — POCT URINALYSIS DIPSTICK
Glucose, UA: NEGATIVE
Nitrite, UA: NEGATIVE
Protein, UA: NEGATIVE
Urobilinogen, UA: 0.2

## 2012-08-24 LAB — POCT UA - MICROSCOPIC ONLY
Casts, Ur, LPF, POC: NEGATIVE
Crystals, Ur, HPF, POC: NEGATIVE
Yeast, UA: NEGATIVE

## 2012-08-24 LAB — COMPREHENSIVE METABOLIC PANEL
ALT: 16 U/L (ref 0–35)
AST: 19 U/L (ref 0–37)
Alkaline Phosphatase: 95 U/L (ref 39–117)
CO2: 24 mEq/L (ref 19–32)
Creat: 0.67 mg/dL (ref 0.50–1.10)
Sodium: 142 mEq/L (ref 135–145)
Total Bilirubin: 0.4 mg/dL (ref 0.3–1.2)
Total Protein: 6.7 g/dL (ref 6.0–8.3)

## 2012-08-24 LAB — IBC PANEL
%SAT: 10 % — ABNORMAL LOW (ref 20–55)
TIBC: 358 ug/dL (ref 250–470)

## 2012-08-24 NOTE — Progress Notes (Signed)
  Subjective:    Patient ID: Faith Thompson, female    DOB: 1945-05-12, 67 y.o.   MRN: [id number]  HPI    Review of Systems  Constitutional: Negative.   HENT: Negative.   Eyes: Negative.   Respiratory: Negative.   Cardiovascular: Negative.   Gastrointestinal: Negative.   Endocrine: Negative.   Genitourinary: Negative.   Musculoskeletal: Negative.   Skin: Negative.   Allergic/Immunologic: Negative.   Neurological: Negative.   Hematological: Negative.   Psychiatric/Behavioral: Negative.        Objective:   Physical Exam        Assessment & Plan:

## 2012-08-24 NOTE — Progress Notes (Signed)
[address]   [place], [place]  [address]   [phone]  Subjective:    Patient ID: Faith Thompson, female    DOB: [birth year], 67 y.o.   MRN: [id number]  HPI This 67 y.o. female presents for evaluation for Annual Wellness Exam.  Last physical 05/2011. Pap smear 11/18/11. Mammogram due; needs referral.  [place].   Bone density scan years ago.   Colonoscopy 02/24/2008, 06/2012. Tetanus 2008. Pneumovax 2011. Zostavax 2008. Flu vaccine 2013. Eye exam 2014; +glasses.  +small cataract.  Drooping eyelids.   Dental exam.  R facial skin cancer:  S/p resection of basal cell carcinoma.  Small mole that had changed/growing.  [place]. [facility] to Dr. Jaye Beagle for Mohs procedure; only needed one surgical correction.    OSA: s/p sleep study; +OSA; fitted with CPAP.  Brought in report.  Took machine home; plugged it up; forgot to plug air into back; just realized   R 2nd toe deviation: to undergo surgical correction by [employer] in the fall.    Review of Systems See above ROS; all negative.  Past Medical History  Diagnosis Date  . Anemia   . Asthma   . Depression   . Ulcer   . Overactive bladder   . Allergic rhinitis, cause unspecified   . GERD (gastroesophageal reflux disease)   . Factor V Leiden mutation   . Obesity, unspecified   . Dysthymic disorder   . Macular degeneration (senile) of retina, unspecified   . Gastritis 11/24/2011    EGD: +gastritis; AVM gastric region, pulsatile mass in stomach  . 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  . 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  . Basal cell carcinoma 06/09/12    R facial region; s/p Mohs procedure at [facility].  . OSA on CPAP 05/24/2012    sleep study +OSA; CPAP titration placed.   Past Surgical History  Procedure Laterality Date  . Tonsillectomy  age 85  . Cholecystectomy  1993  . Abdominal  hysterectomy  1996    fibroids ovaries removed  . Colonoscopy  10/2011    AVM bleeding in colon s/p cauterization.  Symptoms: anemia, hemoccult +.  . Esophagogastroduodenoscopy  10/2011    gastritis, AVM of stomach non-bleeding, pulsatile mass of stomach.  Symptoms: iron defficiency anemia with hemocult +.  . Esophagogastroduodenoscopy  [id number]    NL esophagus.-Bile gastritis.This was biopsied, pulsatile extrinsic mass at the bulb.A few non bleeding angioectasias in the stomach. Sucralfate tab at 1 gram po 2x day indefintely. repeat upper endoscopy prn for surveillance. Schedule an ultrasound of the abdomen to look for aneurysm of gastroduodenal artery.  . Sleep study  05/24/2012    +OSA; CPAP fitted.   Allergies  Allergen Reactions  . Codeine Itching   Current Outpatient Prescriptions on File Prior to Visit  Medication Sig Dispense Refill  . budesonide-formoterol (SYMBICORT) 160-4.5 MCG/ACT inhaler Inhale 1 puff into the lungs 2 (two) times daily.      . calcium carbonate (OS-CAL) 600 MG TABS Take 600 mg by mouth 2 (two) times daily with a meal.      . cetirizine (ZYRTEC) 10 MG tablet Take 10 mg by mouth daily.      . citalopram (CELEXA) 20 MG tablet 1.5 tablets daily  45 tablet  11  . fish oil-omega-3 fatty acids 1000 MG capsule Take 2 g by mouth daily.      Marland Kitchen  Lutein 6 MG CAPS Take by mouth daily.      Marland Kitchen omeprazole (PRILOSEC) 20 MG capsule TAKE ONE CAPSULE BY MOUTH DAILY  30 capsule  5  . solifenacin (VESICARE) 10 MG tablet Take 5 mg by mouth daily.      . sucralfate (CARAFATE) 1 G tablet Take 1 g by mouth 2 (two) times daily.      Marland Kitchen triamcinolone cream (KENALOG) 0.1 % Apply topically 2 (two) times daily as needed.  45 g  1  . benzonatate (TESSALON) 100 MG capsule Take 100 mg by mouth 3 (three) times daily as needed.      . ferrous sulfate 325 (65 FE) MG tablet Take 325 mg by mouth daily with breakfast.       No current facility-administered medications on file prior to visit.    History   Social History  . Marital Status: Married    Spouse Name: N/A    Number of Children: 3  . Years of Education: N/A   Occupational History  . Homemaker    Social History Main Topics  . Smoking status: Never Smoker   . Smokeless tobacco: Not on file  . Alcohol Use: Yes     Comment: occasional very rarely social events  . Drug Use: No  . Sexually Active: Yes   Other Topics Concern  . Not on file   Social History Narrative   Smoke alarm and carbon monoxide detector in the home.No guns in the home.Always uses seat belts. Married x 45 years Happily no abuse. Caffeine use: Coffee 2 servings per day. 4 grandchildren. Exercise: Moderate, Walking 30 minutes daily on treadmill. Patient does have LIVING WILL patient does have HCPOA. Husband is HCPOA: FULL CODE.   Family History  Problem Relation Age of Onset  . Diabetes Mother   . Cancer Father     lung  . Hypertension Sister   . Heart disease Maternal Uncle   . Heart murmur Sister        Objective:   Physical Exam  Nursing note and vitals reviewed. Constitutional: She is oriented to person, place, and time. She appears well-developed and well-nourished. No distress.  HENT:  Head: Normocephalic and atraumatic.  Right Ear: External ear normal.  Left Ear: External ear normal.  Nose: Nose normal.  Mouth/Throat: Oropharynx is clear and moist.  Eyes: Conjunctivae and EOM are normal. Pupils are equal, round, and reactive to light.  Neck: Normal range of motion. Neck supple. No thyromegaly present.  Cardiovascular: Normal rate, regular rhythm, normal heart sounds and intact distal pulses.  Exam reveals no gallop and no friction rub.   No murmur heard. Pulmonary/Chest: Effort normal and breath sounds normal. She has no wheezes. She has no rales.  Abdominal: Soft. Bowel sounds are normal. She exhibits no distension and no mass. There is no tenderness. There is no rebound and no guarding.  Genitourinary: Vagina normal. No  breast swelling, tenderness, discharge or bleeding. There is no rash, tenderness or lesion on the right labia. There is no rash, tenderness or lesion on the left labia.  Musculoskeletal: Normal range of motion.  Lymphadenopathy:    She has no cervical adenopathy.  Neurological: She is alert and oriented to person, place, and time. She has normal reflexes. No cranial nerve deficit. She exhibits normal muscle tone. Coordination normal.  Skin: Skin is warm and dry. No rash noted. She is not diaphoretic. No erythema. No pallor.  Psychiatric: She has a normal mood and affect. Her  behavior is normal. Judgment and thought content normal.   EKG: NSR; no ST changes.    Assessment & Plan:  Routine general medical examination at a health care facility - Plan: POCT urinalysis dipstick, EKG 12-Lead, CBC with Differential, Comprehensive metabolic panel, Hemoglobin A1c, IBC panel, POCT UA - Microscopic Only  Iron deficiency anemia, unspecified - Plan: CBC with Differential, IBC panel  Breast cancer screening - Plan: MM Digital Screening  OSA on CPAP  Hypersomnolence  Overactive bladder    Meds ordered this encounter  Medications  . DISCONTD: oxybutynin (DITROPAN-XL) 10 MG 24 hr tablet    Sig: Take 10 mg by mouth daily.

## 2012-08-30 ENCOUNTER — Encounter: Payer: Self-pay | Admitting: Family Medicine

## 2012-09-20 ENCOUNTER — Other Ambulatory Visit: Payer: Self-pay

## 2012-09-20 MED ORDER — OXYBUTYNIN CHLORIDE ER 10 MG PO TB24: 10.0000 mg | ORAL_TABLET | Freq: Every day | ORAL | Status: DC

## 2012-09-28 ENCOUNTER — Other Ambulatory Visit: Payer: Self-pay

## 2012-09-28 DIAGNOSIS — G4733 Obstructive sleep apnea (adult) (pediatric): Secondary | ICD-10-CM | POA: Insufficient documentation

## 2012-09-28 DIAGNOSIS — Z0000 Encounter for general adult medical examination without abnormal findings: Secondary | ICD-10-CM | POA: Insufficient documentation

## 2012-09-28 NOTE — Assessment & Plan Note (Signed)
Anticipatory guidance --- weight loss, exercise. Pap smear UTD; refer for mammogram. Colonoscopy UTD. Immunizations UTD.  Being treated for depression. No hearing loss. Low fall risk. Independent with ADLs.  Full code.

## 2012-09-28 NOTE — Assessment & Plan Note (Signed)
New.  Compliance with CPAP machine but has been using improperly; now using appropriately; monitor over the next three months to see if fatigue and hypersomnolence improves.

## 2012-09-28 NOTE — Assessment & Plan Note (Signed)
Improved; repeat CBC today.  Asymptomatic.

## 2012-09-28 NOTE — Assessment & Plan Note (Signed)
Stable at this time; continue Celexa at current dose.

## 2012-09-28 NOTE — Assessment & Plan Note (Signed)
Stable on current therapy; Vesicare too expensive.

## 2012-10-11 ENCOUNTER — Ambulatory Visit (INDEPENDENT_AMBULATORY_CARE_PROVIDER_SITE_OTHER): Payer: Medicare Other | Admitting: Family Medicine

## 2012-10-11 ENCOUNTER — Encounter: Payer: Self-pay | Admitting: Family Medicine

## 2012-10-11 VITALS — BP 139/75 | HR 67 | Temp 97.7°F | Resp 16 | Ht 62.0 in | Wt 257.0 lb

## 2012-10-11 DIAGNOSIS — G4733 Obstructive sleep apnea (adult) (pediatric): Secondary | ICD-10-CM

## 2012-10-11 DIAGNOSIS — F329 Major depressive disorder, single episode, unspecified: Secondary | ICD-10-CM | POA: Diagnosis not present

## 2012-10-11 DIAGNOSIS — D509 Iron deficiency anemia, unspecified: Secondary | ICD-10-CM | POA: Diagnosis not present

## 2012-10-11 DIAGNOSIS — G2581 Restless legs syndrome: Secondary | ICD-10-CM | POA: Insufficient documentation

## 2012-10-11 DIAGNOSIS — S93409A Sprain of unspecified ligament of unspecified ankle, initial encounter: Secondary | ICD-10-CM | POA: Diagnosis not present

## 2012-10-11 DIAGNOSIS — R5383 Other fatigue: Secondary | ICD-10-CM

## 2012-10-11 DIAGNOSIS — R5381 Other malaise: Secondary | ICD-10-CM

## 2012-10-11 DIAGNOSIS — F32A Depression, unspecified: Secondary | ICD-10-CM

## 2012-10-11 DIAGNOSIS — F3289 Other specified depressive episodes: Secondary | ICD-10-CM

## 2012-10-11 LAB — CBC WITH DIFFERENTIAL/PLATELET
Basophils Relative: 1 % (ref 0–1)
Eosinophils Absolute: 0.1 10*3/uL (ref 0.0–0.7)
MCH: 27.9 pg (ref 26.0–34.0)
MCHC: 34.2 g/dL (ref 30.0–36.0)
Neutrophils Relative %: 53 % (ref 43–77)
Platelets: 300 10*3/uL (ref 150–400)
RDW: 15.5 % (ref 11.5–15.5)

## 2012-10-11 LAB — IRON: Iron: 172 ug/dL — ABNORMAL HIGH (ref 42–145)

## 2012-10-11 MED ORDER — CITALOPRAM HYDROBROMIDE 20 MG PO TABS: ORAL_TABLET | ORAL | Status: DC

## 2012-10-11 NOTE — Patient Instructions (Signed)
1.  Go with Oswaldo Done to psychiatry appointment next week. 2.  Call Misty Stanley with sleep apnea machine company for new mask. 3.  Wear supportive shoes.

## 2012-10-11 NOTE — Progress Notes (Signed)
[address]   [place], [place]  [address]   [phone]  Subjective:    Patient ID: Faith Thompson, female    DOB: 07-02-1945, 67 y.o.   MRN: [id number]  HPI This 67 y.o. female presents with husband for six week follow-up and evaluation of the following:  1.  OSA:  Using CPAP machine correctly now; does not feel any better.  Still so tired/fatigue/hypersomnolent.  Using CPAP every night.  Has pulled nasal cannula mask off twice during the night but compliance normally good.  Sleep study did reveal RLS and recommended possible treatment of RLS if symptoms persisted.  Pt reluctant to start medication for RLS.   Plans to contact [facility] agency to be evaluated for full face mask.  2.  Leg pain:  B leg and feet pain.  No walking anymore.  Not exercising at all.  Onset of pain in past two days; did go shopping last week; wore sandal that was not very supportive.  Pain started after shopping trip. No leg swelling; +lateral ankle pain.  +calf pain B.  Soreness.  No medications; no redness to skin; no pain along  Varicose veins.  No calf swelling.  3.  Anxiety and depression: husband reports that anxiety has greatly worsened; patient anticipating the death of brother in law who has liver cancer; pt also anticipating death of mother-in-law who is 36.  Husband reports that patient has not grieved nor dealt with death of own parents.  Husband really thinks that patient could benefit from psychotherapy.  Excessive worry regarding children and grandchildren. No motivation; isolating self some; decreased social interaction.  +fatigue.  Not exercising.  Very reluctant to undergo counseling; "I am private; I don't discuss my issues with people".  Pt admits that she does not talk with daughters; "I don't want to worry them".  Does not talk about stressors with husband.  Does not talk with sisters.  Strained relationship with sisters.  Planning to move to [facility] but son and daughter who lives in [place] are upset  with move. Compliance with Celexa 30mg  daily.    4.  Anemia: Hemoglobin had dropped at last visit; presenting for repeat labs today; wants to make sure that anemia has not recurred.  No n/v/d/c; no melena or bloody stools; no abdominal pain.   Review of Systems  Constitutional: Positive for fatigue. Negative for fever, chills and diaphoresis.  Respiratory: Negative for shortness of breath.   Cardiovascular: Negative for chest pain and palpitations.  Gastrointestinal: Negative for nausea, vomiting, abdominal pain, diarrhea, constipation, blood in stool, abdominal distention and anal bleeding.  Endocrine: Negative for cold intolerance, heat intolerance, polydipsia and polyphagia.  Musculoskeletal: Positive for myalgias, arthralgias and gait problem. Negative for back pain and joint swelling.  Hematological: Negative for adenopathy. Bruises/bleeds easily.  Psychiatric/Behavioral: Positive for sleep disturbance, dysphoric mood and decreased concentration. Negative for suicidal ideas and self-injury. The patient is nervous/anxious.    Past Medical History  Diagnosis Date  . Anemia   . Asthma   . Depression   . Ulcer   . Overactive bladder   . Allergic rhinitis, cause unspecified   . GERD (gastroesophageal reflux disease)   . Factor V Leiden mutation   . Obesity, unspecified   . Dysthymic disorder   . Macular degeneration (senile) of retina, unspecified   . Gastritis 11/24/2011    EGD: +gastritis; AVM gastric region, pulsatile mass in stomach  . 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  . 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  . Basal cell carcinoma 06/09/12    R facial region; s/p [facility]s procedure at [facility].  . OSA on CPAP 05/24/2012    sleep study +OSA; CPAP titration placed.   Past Surgical History  Procedure Laterality Date  . Tonsillectomy   age 70  . Cholecystectomy  1993  . Abdominal hysterectomy  1996    fibroids ovaries removed  . Colonoscopy  10/2011    AVM bleeding in colon s/p cauterization.  Symptoms: anemia, hemoccult +.  . Esophagogastroduodenoscopy  10/2011    gastritis, AVM of stomach non-bleeding, pulsatile mass of stomach.  Symptoms: iron defficiency anemia with hemocult +.  . Esophagogastroduodenoscopy  [id number]    NL esophagus.-Bile gastritis.This was biopsied, pulsatile extrinsic mass at the bulb.A few non bleeding angioectasias in the stomach. Sucralfate tab at 1 gram po 2x day indefintely. repeat upper endoscopy prn for surveillance. Schedule an ultrasound of the abdomen to look for aneurysm of gastroduodenal artery.  . Sleep study  05/24/2012    +OSA; CPAP fitted.   Allergies  Allergen Reactions  . Codeine Itching   Current Outpatient Prescriptions on File Prior to Visit  Medication Sig Dispense Refill  . budesonide-formoterol (SYMBICORT) 160-4.5 MCG/ACT inhaler Inhale 1 puff into the lungs 2 (two) times daily.      . fish oil-omega-3 fatty acids 1000 MG capsule Take 2 g by mouth daily.      . Lutein 6 MG CAPS Take by mouth daily.      Marland Kitchen omeprazole (PRILOSEC) 20 MG capsule TAKE ONE CAPSULE BY MOUTH DAILY  30 capsule  5  . oxybutynin (DITROPAN-XL) 10 MG 24 hr tablet Take 1 tablet (10 mg total) by mouth daily.  30 tablet  5  . sucralfate (CARAFATE) 1 G tablet Take 1 g by mouth 2 (two) times daily.      . calcium carbonate (OS-CAL) 600 MG TABS Take 600 mg by mouth 2 (two) times daily with a meal.      . cetirizine (ZYRTEC) 10 MG tablet Take 10 mg by mouth daily.      Marland Kitchen triamcinolone cream (KENALOG) 0.1 % Apply topically 2 (two) times daily as needed.  45 g  1   No current facility-administered medications on file prior to visit.       Objective:   Physical Exam  Nursing note and vitals reviewed. Constitutional: She is oriented to person, place, and time. She appears well-developed and well-nourished. No  distress.  Cardiovascular: Normal rate, regular rhythm and normal heart sounds.   Pulmonary/Chest: Effort normal and breath sounds normal. She has no wheezes. She has no rales.  Musculoskeletal:       Right ankle: She exhibits decreased range of motion. She exhibits no swelling. Tenderness. Lateral malleolus tenderness found. No posterior TFL, no head of 5th metatarsal and no proximal fibula tenderness found. Achilles tendon normal.       Left ankle: She exhibits decreased range of motion. She exhibits no swelling and no ecchymosis. Tenderness. Lateral malleolus tenderness found. No medial malleolus, no posterior TFL, no head of 5th metatarsal and no proximal fibula tenderness found.       Right foot: Normal.       Left foot: Normal.  Neurological: She is alert and oriented to person, place, and time. She has normal reflexes. No cranial nerve deficit. She exhibits normal  muscle tone. Coordination normal.  Skin: Skin is warm and dry. No rash noted. She is not diaphoretic.  Psychiatric: She has a normal mood and affect. Her behavior is normal. Judgment and thought content normal.       Assessment & Plan:  Iron deficiency anemia, unspecified - Plan: CBC with Differential, Iron, IBC Panel  Depression - Plan: citalopram (CELEXA) 20 MG tablet  OSA on CPAP  Sprain of ankle, unspecified site  Restless leg syndrome  1.  Iron deficiency anemia: stable at last visit; repeat labs due to persistent fatigue.  If worsening, recommend follow-up with GI for reevaluation.  2.  Depression with anxiety: worsening; increase Citalopram to 40mg  daily; highly encourage intensive psychotherapy; patient to consider and to discuss with husband's psychiatrist.  Reluctant to try different medication. 3.  OSA with CPAP: stable; no improvement in fatigue with treatment of OSA; consider addition of Requip or Mirapex at next visit at bedtime. 4.  Ankle sprains B;  New.  Onset after prolonged shopping trip; recommend  rest, ice, elevation, supportive tennis shoes. 5. RLS: uncontrolled; patient reluctant to start medication. 6.  Fatigue: persistent; feel secondary to uncontrolled depression with anxiety and lack of exercise; obtain labs.   Meds ordered this encounter  Medications  . citalopram (CELEXA) 20 MG tablet    Sig: 2 tablets daily    Dispense:  60 tablet    Refill:  11    Meds ordered this encounter  Medications  . citalopram (CELEXA) 20 MG tablet    Sig: 2 tablets daily    Dispense:  60 tablet    Refill:  11

## 2012-10-12 ENCOUNTER — Encounter: Payer: Self-pay | Admitting: Family Medicine

## 2012-10-13 ENCOUNTER — Telehealth: Payer: Self-pay

## 2012-10-13 DIAGNOSIS — M25579 Pain in unspecified ankle and joints of unspecified foot: Secondary | ICD-10-CM

## 2012-10-13 NOTE — Telephone Encounter (Signed)
Pt is needing to talk with dr Katrinka Blazing about a referral to [facility]  [facility] number [phone]

## 2012-10-14 NOTE — Telephone Encounter (Signed)
Pt says she has a podiatrist here in [place] and they said that she was going to need surgery for a bone spur on her 2nd toe, but she doesn't want to have surgery in [place]. Wants to know if we can refer her to [facility]. Says she mentioned to you at her last OV about her bone spur

## 2012-10-17 NOTE — Telephone Encounter (Signed)
Patient advised.

## 2012-10-17 NOTE — Telephone Encounter (Signed)
Referral to [facility] podiatry placed; she should be contacted with appointment in upcoming week.

## 2012-11-02 ENCOUNTER — Encounter: Payer: Self-pay | Admitting: Family Medicine

## 2012-11-22 DIAGNOSIS — S93129A Dislocation of metatarsophalangeal joint of unspecified toe(s), initial encounter: Secondary | ICD-10-CM | POA: Diagnosis not present

## 2012-11-30 ENCOUNTER — Encounter: Payer: Self-pay | Admitting: Family Medicine

## 2012-11-30 ENCOUNTER — Ambulatory Visit (INDEPENDENT_AMBULATORY_CARE_PROVIDER_SITE_OTHER): Payer: Medicare Other | Admitting: Family Medicine

## 2012-11-30 VITALS — BP 134/70 | HR 82 | Temp 98.6°F | Resp 16 | Ht 63.0 in | Wt 258.0 lb

## 2012-11-30 DIAGNOSIS — F3289 Other specified depressive episodes: Secondary | ICD-10-CM

## 2012-11-30 DIAGNOSIS — F4389 Other reactions to severe stress: Secondary | ICD-10-CM

## 2012-11-30 DIAGNOSIS — Z23 Encounter for immunization: Secondary | ICD-10-CM | POA: Diagnosis not present

## 2012-11-30 DIAGNOSIS — G4710 Hypersomnia, unspecified: Secondary | ICD-10-CM | POA: Diagnosis not present

## 2012-11-30 DIAGNOSIS — F329 Major depressive disorder, single episode, unspecified: Secondary | ICD-10-CM | POA: Diagnosis not present

## 2012-11-30 DIAGNOSIS — G4700 Insomnia, unspecified: Secondary | ICD-10-CM

## 2012-11-30 DIAGNOSIS — G2581 Restless legs syndrome: Secondary | ICD-10-CM | POA: Diagnosis not present

## 2012-11-30 DIAGNOSIS — F438 Other reactions to severe stress: Secondary | ICD-10-CM

## 2012-11-30 DIAGNOSIS — G4733 Obstructive sleep apnea (adult) (pediatric): Secondary | ICD-10-CM

## 2012-11-30 DIAGNOSIS — D509 Iron deficiency anemia, unspecified: Secondary | ICD-10-CM | POA: Diagnosis not present

## 2012-11-30 DIAGNOSIS — F32A Depression, unspecified: Secondary | ICD-10-CM

## 2012-11-30 LAB — IRON AND TIBC
%SAT: 23 % (ref 20–55)
Iron: 81 ug/dL (ref 42–145)
UIBC: 270 ug/dL (ref 125–400)

## 2012-11-30 LAB — CBC
Hemoglobin: 13 g/dL (ref 12.0–15.0)
Platelets: 296 10*3/uL (ref 150–400)
RBC: 4.6 MIL/uL (ref 3.87–5.11)
WBC: 7.2 10*3/uL (ref 4.0–10.5)

## 2012-11-30 MED ORDER — ROPINIROLE HCL 0.25 MG PO TABS: ORAL_TABLET | ORAL | Status: DC

## 2012-11-30 MED ORDER — CITALOPRAM HYDROBROMIDE 40 MG PO TABS: ORAL_TABLET | ORAL | Status: DC

## 2012-11-30 NOTE — Progress Notes (Signed)
[address]   [place], [place]  [address]   [phone]  Subjective:    Patient ID: Faith Thompson, female    DOB: 06-24-1945, 67 y.o.   MRN: [id number]  HPI This 67 y.o. female presents for two month follow-up:  1.  Fatigue: has improved since last visit.  Sleeping better; still wakes up at night but only if dog needs out.    2. OSA:  Compliance with CPAP; average daily use 5:45 hours.  Having some epistaxis; now using nasal saline which pt hopes will help prevent epistaxis.  Respiratory therapist has recommended nasal saline.  Advised to also to raise humidity in machine from 70 to 72%.  Recommend neosporin if any cracks in nose.   No dreaming still.  Has been reluctant to take RLS medication but now agreeable.  3.  Depression/anxiety: two month follow-up; changes made at last visit include increasing Citalopram to 40mg  daily; feeling better.  Brother in law died of liver cancer and cirrhosis. Patient was not able to attend funeral; did not think she could handle it.  Leaving next week to stay with sister for one week to provide support.  Granddaughter, Dahlia Client, is doing relatively well but is still struggling.  Grandson admitted to being an alcoholic after getting DWI at [facility].  Sleeping better.  Getting anxious in large crowds.  4. Anemia:  Two month follow-up; Hgb 13+ at last visit but iron levels elevated; decreased iron supplement to one tablet every other day.   5. RLS:  Not currently being treated; agreeable to trial of treatment today.  6.  Foot pain:  S/p evaluation by podiatry at [facility]: recommended a different size shoe.  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  Did not recommend surgery.  Recommended EE New Balance shoe.  Places silicone between two toes.  7.  Calf pain: resolved after last visit.  8. Flu vaccine: requesting.  Review of Systems  Constitutional: Positive for fatigue. Negative for chills, diaphoresis, activity change and appetite change.  Respiratory: Positive for apnea.  Negative for shortness of breath.   Cardiovascular: Negative for chest pain, palpitations and leg swelling.  Neurological: Negative for dizziness, tremors, seizures, syncope, facial asymmetry, speech difficulty, weakness, light-headedness, numbness and headaches.  Psychiatric/Behavioral: Positive for dysphoric mood. Negative for suicidal ideas, sleep disturbance and self-injury. The patient is nervous/anxious.    Past Medical History  Diagnosis Date  . Anemia   . Asthma   . Depression   . Ulcer   . Overactive bladder   . Allergic rhinitis, cause unspecified   . GERD (gastroesophageal reflux disease)   . Factor V Leiden mutation   . Obesity, unspecified   . Dysthymic disorder   . Macular degeneration (senile) of retina, unspecified   . Gastritis 11/24/2011    EGD: +gastritis; AVM gastric region, pulsatile mass in stomach  . 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  . 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  . Basal cell carcinoma 06/09/12    R facial region; s/p Mohs procedure at [facility].  . OSA on CPAP 05/24/2012    sleep study +OSA; CPAP titration placed.   Past Surgical History  Procedure Laterality Date  . Tonsillectomy  age 32  . Cholecystectomy  1993  . Abdominal hysterectomy  1996    fibroids ovaries removed  . Colonoscopy  10/2011    AVM bleeding  in colon s/p cauterization.  Symptoms: anemia, hemoccult +.  . Esophagogastroduodenoscopy  10/2011    gastritis, AVM of stomach non-bleeding, pulsatile mass of stomach.  Symptoms: iron defficiency anemia with hemocult +.  . Esophagogastroduodenoscopy  [id number]    NL esophagus.-Bile gastritis.This was biopsied, pulsatile extrinsic mass at the bulb.A few non bleeding angioectasias in the stomach. Sucralfate tab at 1 gram po 2x day indefintely. repeat upper endoscopy prn for surveillance. Schedule an  ultrasound of the abdomen to look for aneurysm of gastroduodenal artery.  . Sleep study  05/24/2012    +OSA; CPAP fitted.   Allergies  Allergen Reactions  . Codeine Itching   Current Outpatient Prescriptions on File Prior to Visit  Medication Sig Dispense Refill  . calcium carbonate (OS-CAL) 600 MG TABS Take 600 mg by mouth 2 (two) times daily with a meal.      . cetirizine (ZYRTEC) 10 MG tablet Take 10 mg by mouth daily.      . fish oil-omega-3 fatty acids 1000 MG capsule Take 2 g by mouth daily.      . Lutein 6 MG CAPS Take by mouth daily.      Marland Kitchen omeprazole (PRILOSEC) 20 MG capsule TAKE ONE CAPSULE BY MOUTH DAILY  30 capsule  5  . oxybutynin (DITROPAN-XL) 10 MG 24 hr tablet Take 1 tablet (10 mg total) by mouth daily.  30 tablet  5  . sucralfate (CARAFATE) 1 G tablet Take 1 g by mouth 2 (two) times daily.      . budesonide-formoterol (SYMBICORT) 160-4.5 MCG/ACT inhaler Inhale 1 puff into the lungs 2 (two) times daily.      Marland Kitchen triamcinolone cream (KENALOG) 0.1 % Apply topically 2 (two) times daily as needed.  45 g  1   No current facility-administered medications on file prior to visit.   History   Social History  . Marital Status: Married    Spouse Name: N/A    Number of Children: 3  . Years of Education: N/A   Occupational History  . Homemaker    Social History Main Topics  . Smoking status: Never Smoker   . Smokeless tobacco: Not on file  . Alcohol Use: Yes     Comment: occasional very rarely social events  . Drug Use: No  . Sexual Activity: Yes   Other Topics Concern  . Not on file   Social History Narrative   Smoke alarm and carbon monoxide detector in the home.No guns in the home.Always uses seat belts. Married x 45 years Happily no abuse. Caffeine use: Coffee 2 servings per day. 4 grandchildren. Exercise: Moderate, Walking 30 minutes daily on treadmill. Patient does have LIVING WILL patient does have HCPOA. Husband is HCPOA: FULL CODE.       Objective:   Physical  Exam  Constitutional: She is oriented to person, place, and time. She appears well-developed and well-nourished. No distress.  HENT:  Head: Normocephalic and atraumatic.  Eyes: Conjunctivae and EOM are normal. Pupils are equal, round, and reactive to light.  Neck: Normal range of motion. Neck supple. No thyromegaly present.  Cardiovascular: Normal rate, regular rhythm, normal heart sounds and intact distal pulses.  Exam reveals no gallop and no friction rub.   No murmur heard. Pulmonary/Chest: Effort normal and breath sounds normal. She has no wheezes. She has no rales.  Lymphadenopathy:    She has no cervical adenopathy.  Neurological: She is alert and oriented to person, place, and time.  Skin: No rash noted. She  is diaphoretic.  Psychiatric: She has a normal mood and affect. Her behavior is normal. Judgment and thought content normal.   INFLUENZA VACCINE ADMINISTERED.    Assessment & Plan:  Iron deficiency anemia, unspecified - Plan: CBC, Iron and TIBC  Need for prophylactic vaccination and inoculation against influenza - Plan: Flu Vaccine QUAD 36+ mos IM  RLS (restless legs syndrome) - Plan: rOPINIRole (REQUIP) 0.25 MG tablet  Depression - Plan: citalopram (CELEXA) 40 MG tablet  ANXIETY, SITUATIONAL  Hypersomnolence  Insomnia  OSA on CPAP  1. Depression with anxiety: improving; continue Celexa 40mg  daily; patient non-compliant with psychotherapy and psychiatry consultation. 2.  OSA: stable; compliant with CPAP but continues to suffer with some fatigue; will also treat RLS and reevaluate. 3.  Fatigue: persistent; continue CPAP; treat RLS; no evidence of anemia. 4.  Anemia: improved; stable on qod iron supplementation; obtain labs. 5.  RLS: uncontrolled; rx for Requip provided. 6.  Insomia: improved with treatment of anxiety/depression and OSA.  Will now treat RLS. 7. S/p flu vaccine.  Meds ordered this encounter  Medications  . ferrous sulfate 325 (65 FE) MG EC tablet      Sig: Take 325 mg by mouth every other day.  Marland Kitchen rOPINIRole (REQUIP) 0.25 MG tablet    Sig: 1 tablet qhs for two weeks; increase to 2 tablets qhs after one week    Dispense:  60 tablet    Refill:  5  . citalopram (CELEXA) 40 MG tablet    Sig: 1 tablet daily    Dispense:  30 tablet    Refill:  11

## 2012-12-01 ENCOUNTER — Encounter: Payer: Self-pay | Admitting: Family Medicine

## 2012-12-28 ENCOUNTER — Ambulatory Visit: Payer: Self-pay | Admitting: Family Medicine

## 2012-12-28 DIAGNOSIS — Z1231 Encounter for screening mammogram for malignant neoplasm of breast: Secondary | ICD-10-CM | POA: Diagnosis not present

## 2013-02-01 ENCOUNTER — Ambulatory Visit (INDEPENDENT_AMBULATORY_CARE_PROVIDER_SITE_OTHER): Payer: Medicare Other | Admitting: Family Medicine

## 2013-02-01 VITALS — BP 142/80 | HR 62 | Temp 98.0°F | Resp 18 | Wt 255.2 lb

## 2013-02-01 DIAGNOSIS — J069 Acute upper respiratory infection, unspecified: Secondary | ICD-10-CM | POA: Diagnosis not present

## 2013-02-01 DIAGNOSIS — J0100 Acute maxillary sinusitis, unspecified: Secondary | ICD-10-CM | POA: Diagnosis not present

## 2013-02-01 DIAGNOSIS — J45901 Unspecified asthma with (acute) exacerbation: Secondary | ICD-10-CM

## 2013-02-01 DIAGNOSIS — G2581 Restless legs syndrome: Secondary | ICD-10-CM

## 2013-02-01 MED ORDER — PREDNISONE 20 MG PO TABS: ORAL_TABLET | ORAL | Status: DC

## 2013-02-01 MED ORDER — AMOXICILLIN-POT CLAVULANATE 875-125 MG PO TABS: 1.0000 | ORAL_TABLET | Freq: Two times a day (BID) | ORAL | Status: DC

## 2013-02-01 MED ORDER — IPRATROPIUM BROMIDE 0.03 % NA SOLN: 2.0000 | Freq: Two times a day (BID) | NASAL | Status: DC

## 2013-02-01 NOTE — Progress Notes (Signed)
Subjective:    Patient ID: Faith Thompson, female    DOB: Jul 17, 1945, 67 y.o.   MRN: [id number] This chart was scribed for Faith Chick, MD by Valera Castle, ED Scribe. This patient was seen in room 11 and the patient's care was started at 5:56 PM.  Chief Complaint  Patient presents with  . Nasal Congestion  . Facial Pain  . Headache  . Cough  . Chest Pain    with coughing    HPI Faith Thompson is a 67 y.o. female who presents to the [facility] complaining of sudden nasal congestion, sinus pressure and pain, rhinorrhea, post nasal drip, headache, wheezing, and intermittent, dry cough, onset 6 days ago. She reports chest pain from coughing, as well as voice change, stating her voice will crack a lot. She reports taking Zyrtec every night. She reports taking her other medications regularly. She denies being allergic to her dog, stating this is the first time she has had these symptoms since getting her dog in 09/2012. She reports having her symptoms before getting their christmas tree. She denies anyone being sick around her.   She reports a cramping feeling in her stomach after taking Requip; abdominal pain stopped when she stopped Requip; she took medication for one month with no improvement in weird sensation in her legs; she is not interested in a trial of Mirapex at this time.   She reports having pneumonia vaccination 4-5 years ago.   She denies fever, chills, diaphoresis, sore throat, nausea, emesis, diarrhea, SOB. She reports visiting her 57 year old mother recently, who has been having episodes of vertigo. She reports staying in the area for the holidays.   PCP - Tillman Abide, MD  Patient Active Problem List   Diagnosis Date Noted  . Restless leg syndrome 10/11/2012  . OSA on CPAP 09/28/2012  . Routine general medical examination at a health care facility 09/28/2012  . Urinary incontinence 02/29/2012  . Snoring 02/29/2012  . Hypersomnolence 02/29/2012  . Overactive bladder  02/09/2012  . Insomnia 02/09/2012  . AVM (arteriovenous malformation) of stomach, acquired 02/09/2012  . Gastritis 02/09/2012  . Depression 02/09/2012  . Iron deficiency anemia, unspecified 02/09/2012  . AVM (arteriovenous malformation) of colon with hemorrhage 02/09/2012  . PNEUMONIA 05/26/2007  . ANXIETY, SITUATIONAL 10/13/2006  . FACTOR V DEFICIENCY 10/06/2006  . MACULAR DEGENERATION 10/06/2006  . SYMPTOM, DISTURBANCE, SLEEP NOS 10/06/2006  . OBESITY NOS 06/10/2006  . ALLERGIC RHINITIS 06/10/2006  . GERD 06/10/2006  . OSTEOARTHRITIS 06/10/2006   Past Medical History  Diagnosis Date  . Anemia   . Asthma   . Depression   . Ulcer   . Overactive bladder   . Allergic rhinitis, cause unspecified   . GERD (gastroesophageal reflux disease)   . Factor V Leiden mutation   . Obesity, unspecified   . Dysthymic disorder   . Macular degeneration (senile) of retina, unspecified   . Gastritis 11/24/2011    EGD: +gastritis; AVM gastric region, pulsatile mass in stomach  . 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  . 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  . Basal cell carcinoma 06/09/12    R facial region; s/p Mohs procedure at [facility].  . OSA on CPAP 05/24/2012    sleep study +OSA; CPAP titration placed.   Past Surgical History  Procedure Laterality Date  . Tonsillectomy  age 24  . Cholecystectomy  62  . Abdominal hysterectomy  1996    fibroids ovaries removed  . Colonoscopy  10/2011    AVM bleeding in colon s/p cauterization.  Symptoms: anemia, hemoccult +.  . Esophagogastroduodenoscopy  10/2011    gastritis, AVM of stomach non-bleeding, pulsatile mass of stomach.  Symptoms: iron defficiency anemia with hemocult +.  . Esophagogastroduodenoscopy  [id number]    NL esophagus.-Bile gastritis.This was biopsied, pulsatile extrinsic mass at the bulb.A  few non bleeding angioectasias in the stomach. Sucralfate tab at 1 gram po 2x day indefintely. repeat upper endoscopy prn for surveillance. Schedule an ultrasound of the abdomen to look for aneurysm of gastroduodenal artery.  . Sleep study  05/24/2012    +OSA; CPAP fitted.   Allergies  Allergen Reactions  . Codeine Itching   Prior to Admission medications   Medication Sig Start Date End Date Taking? Authorizing Provider  calcium carbonate (OS-CAL) 600 MG TABS Take 600 mg by mouth 2 (two) times daily with a meal.   Yes Historical Provider, MD  cetirizine (ZYRTEC) 10 MG tablet Take 10 mg by mouth daily.   Yes Historical Provider, MD  citalopram (CELEXA) 40 MG tablet 1 tablet daily 11/30/12  Yes Faith Chick, MD  ferrous sulfate 325 (65 FE) MG EC tablet Take 325 mg by mouth every other day.   Yes Historical Provider, MD  fish oil-omega-3 fatty acids 1000 MG capsule Take 2 g by mouth daily.   Yes Historical Provider, MD  Lutein 6 MG CAPS Take by mouth daily.   Yes Historical Provider, MD  omeprazole (PRILOSEC) 20 MG capsule TAKE ONE CAPSULE BY MOUTH DAILY 10/24/11  Yes Morrell Riddle, PA-C  oxybutynin (DITROPAN-XL) 10 MG 24 hr tablet Take 1 tablet (10 mg total) by mouth daily. 09/20/12  Yes Faith Chick, MD  rOPINIRole (REQUIP) 0.25 MG tablet 1 tablet qhs for two weeks; increase to 2 tablets qhs after one week 11/30/12  Yes Faith Chick, MD  sucralfate (CARAFATE) 1 G tablet Take 1 g by mouth 2 (two) times daily.   Yes Historical Provider, MD  triamcinolone cream (KENALOG) 0.1 % Apply topically 2 (two) times daily as needed. 11/18/11  Yes Faith Chick, MD  budesonide-formoterol (SYMBICORT) 160-4.5 MCG/ACT inhaler Inhale 1 puff into the lungs 2 (two) times daily.    Historical Provider, MD   Family History  Problem Relation Age of Onset  . Diabetes Mother   . Cancer Father     lung  . Hypertension Sister   . Heart disease Maternal Uncle   . Heart murmur Sister    History   Social History    . Marital Status: Married    Spouse Name: N/A    Number of Children: 3  . Years of Education: N/A   Occupational History  . Homemaker    Social History Main Topics  . Smoking status: Never Smoker   . Smokeless tobacco: Not on file  . Alcohol Use: Yes     Comment: occasional very rarely social events  . Drug Use: No  . Sexual Activity: Yes   Other Topics Concern  . Not on file   Social History Narrative   Smoke alarm and carbon monoxide detector in the home.No guns in the home.Always uses seat belts. Married x 45 years Happily no abuse. Caffeine use: Coffee 2 servings per day. 4 grandchildren. Exercise: Moderate, Walking 30 minutes daily on treadmill. Patient does have LIVING WILL patient does  have HCPOA. Husband is HCPOA: FULL CODE.    Review of Systems  Constitutional: Negative for fever, chills and diaphoresis.  HENT: Positive for congestion, postnasal drip, rhinorrhea, sinus pressure, sneezing and voice change. Negative for ear pain, sore throat and trouble swallowing.   Eyes: Positive for itching (bilateral).  Respiratory: Positive for cough and wheezing. Negative for chest tightness and shortness of breath.   Cardiovascular: Positive for chest pain.  Gastrointestinal: Negative for nausea, vomiting and diarrhea.  Neurological: Positive for headaches. Negative for dizziness and light-headedness.       Objective:   Physical Exam  Nursing note and vitals reviewed. Constitutional: She is oriented to person, place, and time. She appears well-developed and well-nourished. No distress.  HENT:  Head: Normocephalic and atraumatic.  Right Ear: Hearing, tympanic membrane, external ear and ear canal normal.  Left Ear: Hearing, tympanic membrane, external ear and ear canal normal.  Nose: Rhinorrhea (with erythema) present.  Mouth/Throat: Uvula is midline, oropharynx is clear and moist and mucous membranes are normal. No oropharyngeal exudate, posterior oropharyngeal edema or  posterior oropharyngeal erythema.  Eyes: Conjunctivae and EOM are normal. Pupils are equal, round, and reactive to light.  Neck: Normal range of motion. Neck supple. No tracheal deviation present. No thyromegaly present.  Cardiovascular: Normal rate, regular rhythm and normal heart sounds.  Exam reveals no gallop and no friction rub.   No murmur heard. Pulmonary/Chest: Effort normal and breath sounds normal. No respiratory distress. She has no wheezes. She has no rales.  Musculoskeletal: Normal range of motion.  Lymphadenopathy:    She has no cervical adenopathy.  Neurological: She is alert and oriented to person, place, and time.  Skin: Skin is warm and dry. She is not diaphoretic.  Psychiatric: She has a normal mood and affect. Her behavior is normal. Judgment and thought content normal.    BP 142/80  Pulse 62  Temp(Src) 98 F (36.7 C) (Oral)  Resp 18  Wt 255 lb 3.2 oz (115.758 kg)  SpO2 97%     Assessment & Plan:    1. Acute upper respiratory infections of unspecified site   2. Sinusitis, acute maxillary   3. Asthma with acute exacerbation    Meds ordered this encounter  Medications  . amoxicillin-clavulanate (AUGMENTIN) 875-125 MG per tablet    Sig: Take 1 tablet by mouth 2 (two) times daily.    Dispense:  20 tablet    Refill:  0  . predniSONE (DELTASONE) 20 MG tablet    Sig: Two tablets daily x 5 days then one tablet daily x 5 days    Dispense:  15 tablet    Refill:  0  . ipratropium (ATROVENT) 0.03 % nasal spray    Sig: Place 2 sprays into the nose 2 (two) times daily.    Dispense:  30 mL    Refill:  5    1.  URI: New.  Etiology to sinusitis.  Supportive care with rest, fluids, Ibuprofen or Tylenol. 2.  Acute maxillary sinusitis:  New. Rx for Augmentin, Prednisone, atrovent nasal spray; continue Zyrtec daily. 3.  Asthma with mild exacerbation: New.  Rx for Prednisone provided. Use Albuterol PRN. 4.  RLS: uncontrolled; could not tolerate Requip; consider Mirapex  in future if continues to suffer with daytime fatigue.   I personally performed the services described in this documentation, which was scribed in my presence.  The recorded information has been reviewed and is accurate.

## 2013-02-01 NOTE — Patient Instructions (Signed)

## 2013-02-10 ENCOUNTER — Encounter: Payer: Self-pay | Admitting: Family Medicine

## 2013-03-08 ENCOUNTER — Ambulatory Visit: Payer: Medicare Other | Admitting: Family Medicine

## 2013-04-10 ENCOUNTER — Ambulatory Visit (INDEPENDENT_AMBULATORY_CARE_PROVIDER_SITE_OTHER): Payer: Medicare Other | Admitting: Family Medicine

## 2013-04-10 VITALS — BP 142/70 | HR 89 | Temp 98.5°F | Resp 18 | Ht 62.0 in | Wt 255.0 lb

## 2013-04-10 DIAGNOSIS — R05 Cough: Secondary | ICD-10-CM | POA: Diagnosis not present

## 2013-04-10 DIAGNOSIS — R5381 Other malaise: Secondary | ICD-10-CM

## 2013-04-10 DIAGNOSIS — R5383 Other fatigue: Secondary | ICD-10-CM

## 2013-04-10 DIAGNOSIS — J189 Pneumonia, unspecified organism: Secondary | ICD-10-CM

## 2013-04-10 DIAGNOSIS — R059 Cough, unspecified: Secondary | ICD-10-CM

## 2013-04-10 LAB — POCT CBC
GRANULOCYTE PERCENT: 49.3 % (ref 37–80)
HCT, POC: 40.3 % (ref 37.7–47.9)
Hemoglobin: 12.7 g/dL (ref 12.2–16.2)
LYMPH, POC: 2.3 (ref 0.6–3.4)
MCH, POC: 28.3 pg (ref 27–31.2)
MCHC: 31.5 g/dL — AB (ref 31.8–35.4)
MCV: 89.9 fL (ref 80–97)
MID (cbc): 0.4 (ref 0–0.9)
MPV: 8.2 fL (ref 0–99.8)
POC GRANULOCYTE: 2.6 (ref 2–6.9)
POC LYMPH %: 43.2 % (ref 10–50)
POC MID %: 7.5 %M (ref 0–12)
Platelet Count, POC: 272 10*3/uL (ref 142–424)
RBC: 4.48 M/uL (ref 4.04–5.48)
RDW, POC: 15.4 %
WBC: 5.3 10*3/uL (ref 4.6–10.2)

## 2013-04-10 MED ORDER — CEFDINIR 300 MG PO CAPS: 300.0000 mg | ORAL_CAPSULE | Freq: Two times a day (BID) | ORAL | Status: DC

## 2013-04-10 NOTE — Progress Notes (Signed)
[facility] [address], [name] [address] [phone]  Date:  04/10/2013   Name:  Faith Thompson   DOB:  [birth year]   MRN:  [id number]  PCP:  Viviana Simpler, MD    Chief Complaint: Cough, Headache and Otalgia   History of Present Illness:  Faith Thompson is a 68 y.o. very pleasant female patient who presents with the following:  She is here today with illness- she has been sick for nearly one week now.  She has a history of severe pneumonia a few years ago and always gets concerned when she has respiratory sx She has not noted a fever, but she has noted chills, cough, and headache She has used delsym and tylenol at home.   Her right ear hurts too.   She does not note as much sinus sx.  She has a frontal HA today.   She has not noted any GI symptoms No ST.   She has not taken any medications for HA yet today- this is not an unusually severe HA  Patient Active Problem List   Diagnosis Date Noted  . Restless leg syndrome 10/11/2012  . OSA on CPAP 09/28/2012  . Routine general medical examination at a health care facility 09/28/2012  . Urinary incontinence 02/29/2012  . Snoring 02/29/2012  . Hypersomnolence 02/29/2012  . Overactive bladder 02/09/2012  . Insomnia 02/09/2012  . AVM (arteriovenous malformation) of stomach, acquired 02/09/2012  . Gastritis 02/09/2012  . Depression 02/09/2012  . Iron deficiency anemia, unspecified 02/09/2012  . AVM (arteriovenous malformation) of colon with hemorrhage 02/09/2012  . PNEUMONIA 05/26/2007  . ANXIETY, SITUATIONAL 10/13/2006  . FACTOR V DEFICIENCY 10/06/2006  . MACULAR DEGENERATION 10/06/2006  . SYMPTOM, DISTURBANCE, SLEEP NOS 10/06/2006  . OBESITY NOS 06/10/2006  . ALLERGIC RHINITIS 06/10/2006  . GERD 06/10/2006  . OSTEOARTHRITIS 06/10/2006    Past Medical History  Diagnosis Date  . Anemia   . Asthma   . Depression   . Ulcer   . Overactive bladder   . Allergic rhinitis, cause unspecified   .  GERD (gastroesophageal reflux disease)   . Factor V Leiden mutation   . Obesity, unspecified   . Dysthymic disorder   . Macular degeneration (senile) of retina, unspecified   . Gastritis 11/24/2011    EGD: +gastritis; AVM gastric region, pulsatile mass in stomach  . 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  . 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  . Basal cell carcinoma 06/09/12    R facial region; s/p Mohs procedure at [facility].  . OSA on CPAP 05/24/2012    sleep study +OSA; CPAP titration placed.    Past Surgical History  Procedure Laterality Date  . Tonsillectomy  age 52  . Cholecystectomy  1993  . Abdominal hysterectomy  1996    fibroids ovaries removed  . Colonoscopy  10/2011    AVM bleeding in colon s/p cauterization.  Symptoms: anemia, hemoccult +.  . Esophagogastroduodenoscopy  10/2011    gastritis, AVM of stomach non-bleeding, pulsatile mass of stomach.  Symptoms: iron defficiency anemia with hemocult +.  . Esophagogastroduodenoscopy  [id number]    NL esophagus.-Bile gastritis.This was biopsied, pulsatile extrinsic mass at the bulb.A few non bleeding angioectasias in the stomach. Sucralfate tab at 1 gram po 2x day indefintely. repeat upper endoscopy prn for surveillance. Schedule an ultrasound of  the abdomen to look for aneurysm of gastroduodenal artery.  . Sleep study  05/24/2012    +OSA; CPAP fitted.    History  Substance Use Topics  . Smoking status: Never Smoker   . Smokeless tobacco: Not on file  . Alcohol Use: Yes     Comment: occasional very rarely social events    Family History  Problem Relation Age of Onset  . Diabetes Mother   . Cancer Father     lung  . Hypertension Sister   . Heart disease Maternal Uncle   . Heart murmur Sister     Allergies  Allergen Reactions  . Codeine Itching    Medication list has been  reviewed and updated.  Current Outpatient Prescriptions on File Prior to Visit  Medication Sig Dispense Refill  . calcium carbonate (OS-CAL) 600 MG TABS Take 600 mg by mouth 2 (two) times daily with a meal.      . cetirizine (ZYRTEC) 10 MG tablet Take 10 mg by mouth daily.      . citalopram (CELEXA) 40 MG tablet 1 tablet daily  30 tablet  11  . fish oil-omega-3 fatty acids 1000 MG capsule Take 2 g by mouth daily.      . Lutein 6 MG CAPS Take by mouth daily.      Marland Kitchen omeprazole (PRILOSEC) 20 MG capsule TAKE ONE CAPSULE BY MOUTH DAILY  30 capsule  5  . sucralfate (CARAFATE) 1 G tablet Take 1 g by mouth 2 (two) times daily.      Marland Kitchen amoxicillin-clavulanate (AUGMENTIN) 875-125 MG per tablet Take 1 tablet by mouth 2 (two) times daily.  20 tablet  0  . budesonide-formoterol (SYMBICORT) 160-4.5 MCG/ACT inhaler Inhale 1 puff into the lungs 2 (two) times daily.      . ferrous sulfate 325 (65 FE) MG EC tablet Take 325 mg by mouth every other day.      . ipratropium (ATROVENT) 0.03 % nasal spray Place 2 sprays into the nose 2 (two) times daily.  30 mL  5  . predniSONE (DELTASONE) 20 MG tablet Two tablets daily x 5 days then one tablet daily x 5 days  15 tablet  0  . rOPINIRole (REQUIP) 0.25 MG tablet 1 tablet qhs for two weeks; increase to 2 tablets qhs after one week  60 tablet  5  . triamcinolone cream (KENALOG) 0.1 % Apply topically 2 (two) times daily as needed.  45 g  1   No current facility-administered medications on file prior to visit.    Review of Systems:  As per HPI- otherwise negative.   Physical Examination: Filed Vitals:   04/10/13 1240  BP: 142/70  Pulse: 89  Temp: 98.5 F (36.9 C)  Resp: 18   Filed Vitals:   04/10/13 1240  Height: 5\' 2"  (1.575 m)  Weight: 255 lb (115.667 kg)   Body mass index is 46.63 kg/(m^2). Ideal Body Weight: Weight in (lb) to have BMI = 25: 136.4  GEN: WDWN, NAD, Non-toxic, A & O x 3, looks well, obese HEENT: Atraumatic, Normocephalic. Neck supple.  No masses, No LAD.  Bilateral TM wnl, oropharynx normal.  PEERL,EOMI.  Ears and Nose: No external deformity. CV: RRR, No M/G/R. No JVD. No thrill. No extra heart sounds. PULM: CTA B, no wheezes, crackles, rhonchi. No retractions. No resp. distress. No accessory muscle use. EXTR: No c/c/e NEURO Normal gait.  PSYCH: Normally interactive. Conversant. Not depressed or anxious appearing.  Calm demeanor.   Results for  orders placed in visit on 04/10/13  POCT CBC      Result Value Ref Range   WBC 5.3  4.6 - 10.2 K/uL   Lymph, poc 2.3  0.6 - 3.4   POC LYMPH PERCENT 43.2  10 - 50 %L   MID (cbc) 0.4  0 - 0.9   POC MID % 7.5  0 - 12 %M   POC Granulocyte 2.6  2 - 6.9   Granulocyte percent 49.3  37 - 80 %G   RBC 4.48  4.04 - 5.48 M/uL   Hemoglobin 12.7  12.2 - 16.2 g/dL   HCT, POC 40.3  37.7 - 47.9 %   MCV 89.9  80 - 97 fL   MCH, POC 28.3  27 - 31.2 pg   MCHC 31.5 (*) 31.8 - 35.4 g/dL   RDW, POC 15.4     Platelet Count, POC 272  142 - 424 K/uL   MPV 8.2  0 - 99.8 fL    Assessment and Plan: Walking pneumonia - Plan: cefdinir (OMNICEF) 300 MG capsule  Cough - Plan: POCT CBC  Other malaise and fatigue  Treat with omnicef as above.   See patient instructions for more details.  Close follow-up if not better- Sooner if worse.       Signed Lamar Blinks, MD

## 2013-04-10 NOTE — Patient Instructions (Signed)
Use the omnicef for your lungs and sinuses.  Let me know if you are not better in the next few days- Sooner if worse.   Let me know if your headache is not better with OTC medications

## 2013-04-14 ENCOUNTER — Telehealth: Payer: Self-pay

## 2013-04-14 ENCOUNTER — Encounter: Payer: Self-pay | Admitting: Family Medicine

## 2013-04-14 DIAGNOSIS — F329 Major depressive disorder, single episode, unspecified: Secondary | ICD-10-CM

## 2013-04-14 DIAGNOSIS — F32A Depression, unspecified: Secondary | ICD-10-CM

## 2013-04-14 NOTE — Telephone Encounter (Signed)
Pt needs to call Pharmacy for refills. Pt was seen in August and given a year supply at that time.

## 2013-04-14 NOTE — Telephone Encounter (Signed)
celexa refill for 90 days please   [facility] [place]   Dr. Tamala Julian   [phone]

## 2013-04-17 MED ORDER — CITALOPRAM HYDROBROMIDE 40 MG PO TABS: ORAL_TABLET | ORAL | Status: DC

## 2013-04-17 NOTE — Telephone Encounter (Signed)
Spoke with pt, she does have refills, she's just requesting that we change those from a month supply to a 90 day supply. Pt stated that the pharmacy told her to contact us directly.

## 2013-04-17 NOTE — Telephone Encounter (Signed)
Resent the remainder of RFs written by Dr Tamala Julian for the year in 90 day supplies. Notified pt on VM.

## 2013-04-25 MED ORDER — CITALOPRAM HYDROBROMIDE 40 MG PO TABS: ORAL_TABLET | ORAL | Status: DC

## 2013-07-04 DIAGNOSIS — M1710 Unilateral primary osteoarthritis, unspecified knee: Secondary | ICD-10-CM | POA: Diagnosis not present

## 2013-07-04 DIAGNOSIS — Z79899 Other long term (current) drug therapy: Secondary | ICD-10-CM | POA: Diagnosis not present

## 2013-07-04 DIAGNOSIS — D6859 Other primary thrombophilia: Secondary | ICD-10-CM | POA: Diagnosis not present

## 2013-07-04 DIAGNOSIS — M25469 Effusion, unspecified knee: Secondary | ICD-10-CM | POA: Diagnosis not present

## 2013-07-04 DIAGNOSIS — H3530 Unspecified macular degeneration: Secondary | ICD-10-CM | POA: Diagnosis not present

## 2013-07-04 DIAGNOSIS — I998 Other disorder of circulatory system: Secondary | ICD-10-CM | POA: Diagnosis not present

## 2013-08-19 ENCOUNTER — Other Ambulatory Visit: Payer: Self-pay | Admitting: Family Medicine

## 2013-10-16 DIAGNOSIS — H353190 Nonexudative age-related macular degeneration, unspecified eye, stage unspecified: Secondary | ICD-10-CM | POA: Diagnosis not present

## 2013-10-17 ENCOUNTER — Other Ambulatory Visit: Payer: Self-pay | Admitting: Family Medicine

## 2013-10-25 ENCOUNTER — Ambulatory Visit (INDEPENDENT_AMBULATORY_CARE_PROVIDER_SITE_OTHER): Payer: Medicare Other | Admitting: Family Medicine

## 2013-10-25 ENCOUNTER — Encounter: Payer: Self-pay | Admitting: Family Medicine

## 2013-10-25 VITALS — BP 136/72 | HR 79 | Temp 98.0°F | Resp 16 | Ht 61.0 in | Wt 242.2 lb

## 2013-10-25 DIAGNOSIS — L608 Other nail disorders: Secondary | ICD-10-CM

## 2013-10-25 DIAGNOSIS — G4733 Obstructive sleep apnea (adult) (pediatric): Secondary | ICD-10-CM

## 2013-10-25 DIAGNOSIS — G2581 Restless legs syndrome: Secondary | ICD-10-CM

## 2013-10-25 DIAGNOSIS — H3530 Unspecified macular degeneration: Secondary | ICD-10-CM

## 2013-10-25 DIAGNOSIS — Z9989 Dependence on other enabling machines and devices: Secondary | ICD-10-CM

## 2013-10-25 DIAGNOSIS — Z23 Encounter for immunization: Secondary | ICD-10-CM | POA: Diagnosis not present

## 2013-10-25 DIAGNOSIS — D509 Iron deficiency anemia, unspecified: Secondary | ICD-10-CM

## 2013-10-25 DIAGNOSIS — L602 Onychogryphosis: Secondary | ICD-10-CM

## 2013-10-25 DIAGNOSIS — Q846 Other congenital malformations of nails: Secondary | ICD-10-CM

## 2013-10-25 NOTE — Progress Notes (Addendum)
Patient ID: Faith [facility], female   DOB: 1945/06/04, 68 y.o.   MRN: [id number]   Subjective:  This chart was scribed for Reginia Forts, MD by Donato Schultz, Medical Scribe. This patient was seen in Room 21 and the patient's care was started at 3:55 PM.    Patient ID: Faith Thompson, female    DOB: August 01, 1945, 68 y.o.   MRN: [id number]  10/25/2013  Follow-up, Sleep Apnea, Depression and Anemia   HPI HPI Comments: [facility] is a 68 y.o. female who [facility] for a follow-up of depression and sleep apnea.  Her last acute visit was in December of last year and her last routine visit was in October of last year.  She saw an orthopedist in May for knee pain.  She has a CPE scheduled with Dr. Tamala Julian on December 13, 2013.    She is having some administrative difficulties with her CPAP machine and her insurance is not going to cover the cost of repairs and other parts.  She is due for new parts for her machine.  She has a 90 day report from her CPAP machine.  She uses her CPAP machine 87% of the days.  There were 13% of the days where she only used her CPAP machine for less than 4 hours.  She has been sleeping better since starting her CPAP and will nap for 30 minutes once a week which is greatly improved.  She was previously napping 2-3 hours each afternoon prior to CPAP usage.   She is complaining of healing sores on her lower abdomen.  Her symptoms started with a small sore that later started oozing and itching.  Her sores are no longer oozing.  She is unsure as what is causing her symptoms.  She states that some of the sores have healed but will reopen in a couple of days.  They do not itch currently but they did when the sores were raw.  She has applied lotion and Vaseline to the area with some relief to her symptoms.  Area does stay sweaty and warm due to summer months.  Sores are also located along pant line.  She is also complaining of an open sore on the inside of  her left nares.  She has been applying a topical gel at night and putting air in her nares with mild relief to her symptoms.  Respiratory therapist recommended applying topical gel; irritation can occur due to CPAP usage.   She stopped taking her iron at her last CPE.  She takes 40mg  of Citalopram daily and feels like it is working well.  She no longer takes Requip for RLS.  Her bladder medications are working well.    She has not had a pneumonia vaccine but she would like to receive one today.    Her brother-in-law died last 10/31/22.  Her grandson tried to commit suicide 4-5 months ago and checked himself into [facility].  He uses marijuana regularly and sees a psychiatrist.  She believes he is abusing his medication and is an alcoholic.  Her mother-in-law is 17 years old and healthy.   She was diagnosed with a cataract last week and is planning for surgery.  Her vision is cloudy.  Her macular degeneration is doing well and is not growing.   Emotionally she is doing much better.  She has lost over 20 pounds using [employer].    Review of Systems  Constitutional: Negative  for fever, chills, diaphoresis and fatigue.  Eyes: Negative for visual disturbance.  Respiratory: Negative for cough and shortness of breath.   Cardiovascular: Negative for chest pain, palpitations and leg swelling.  Gastrointestinal: Negative for nausea, vomiting, abdominal pain, diarrhea and constipation.  Endocrine: Negative for cold intolerance, heat intolerance, polydipsia, polyphagia and polyuria.  Genitourinary: Positive for dysuria. Negative for urgency, frequency, flank pain, enuresis and difficulty urinating.  Skin: Positive for color change and wound. Negative for pallor and rash.  Neurological: Negative for dizziness, tremors, seizures, syncope, facial asymmetry, speech difficulty, weakness, light-headedness, numbness and headaches.  Psychiatric/Behavioral: Negative for suicidal ideas, sleep disturbance,  self-injury and dysphoric mood. The patient is not nervous/anxious.     Past Medical History  Diagnosis Date  . Anemia   . Asthma   . Depression   . Ulcer   . Overactive bladder   . Allergic rhinitis, cause unspecified   . GERD (gastroesophageal reflux disease)   . Factor V Leiden mutation   . Obesity, unspecified   . Dysthymic disorder   . Macular degeneration (senile) of retina, unspecified   . Gastritis 11/24/2011    EGD: +gastritis; AVM gastric region, pulsatile mass in stomach  . 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  . 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  . Basal cell carcinoma 06/09/12    R facial region; s/p Mohs procedure at [facility].  . OSA on CPAP 05/24/2012    sleep study +OSA; CPAP titration placed.   Past Surgical History  Procedure Laterality Date  . Tonsillectomy  age 70  . Cholecystectomy  1993  . Abdominal hysterectomy  1996    fibroids ovaries removed  . Colonoscopy  10/2011    AVM bleeding in colon s/p cauterization.  Symptoms: anemia, hemoccult +.  . Esophagogastroduodenoscopy  10/2011    gastritis, AVM of stomach non-bleeding, pulsatile mass of stomach.  Symptoms: iron defficiency anemia with hemocult +.  . Esophagogastroduodenoscopy  [id number]    NL esophagus.-Bile gastritis.This was biopsied, pulsatile extrinsic mass at the bulb.A few non bleeding angioectasias in the stomach. Sucralfate tab at 1 gram po 2x day indefintely. repeat upper endoscopy prn for surveillance. Schedule an ultrasound of the abdomen to look for aneurysm of gastroduodenal artery.  . Sleep study  05/24/2012    +OSA; CPAP fitted.   Allergies  Allergen Reactions  . Codeine Itching  . Adhesive [Tape] Rash    Any tape - paper, bandaid   Current Outpatient Prescriptions  Medication Sig Dispense Refill  . calcium carbonate (OS-CAL) 600 MG TABS  Take 600 mg by mouth 2 (two) times daily with a meal.      . cetirizine (ZYRTEC) 10 MG tablet Take 10 mg by mouth daily.      . citalopram (CELEXA) 40 MG tablet 1 tablet daily  90 tablet  2  . fish oil-omega-3 fatty acids 1000 MG capsule Take 2 g by mouth daily.      . Lutein 6 MG CAPS Take by mouth daily.      Marland Kitchen omeprazole (PRILOSEC) 20 MG capsule TAKE ONE CAPSULE BY MOUTH DAILY  30 capsule  5  . OVER THE COUNTER MEDICATION Redcaps for eyes      . oxybutynin (DITROPAN-XL) 10 MG 24 hr tablet Take 5 mg by mouth daily.      . sucralfate (CARAFATE) 1 G tablet Take 1 g  by mouth 2 (two) times daily.      . budesonide-formoterol (SYMBICORT) 160-4.5 MCG/ACT inhaler Inhale 1 puff into the lungs 2 (two) times daily.      Marland Kitchen oxybutynin (DITROPAN-XL) 10 MG 24 hr tablet TAKE ONE TABLET BY MOUTH EVERY DAY  30 tablet  0  . triamcinolone cream (KENALOG) 0.1 % Apply topically 2 (two) times daily as needed.  45 g  1   No current facility-administered medications for this visit.    Objective:   BP 136/72  Pulse 79  Temp(Src) 98 F (36.7 C) (Oral)  Resp 16  Ht 5\' 1"  (1.549 m)  Wt 242 lb 3.2 oz (109.861 kg)  BMI 45.79 kg/m2  SpO2 97%  Physical Exam  Nursing note and vitals reviewed. Constitutional: She is oriented to person, place, and time. She appears well-developed and well-nourished.  HENT:  Head: Normocephalic and atraumatic.  Mouth/Throat: Oropharynx is clear and moist. Mucous membranes are dry. No oropharyngeal exudate.  Eyes: Conjunctivae and EOM are normal. Pupils are equal, round, and reactive to light.  Neck: Normal range of motion.  Cardiovascular: Normal rate, regular rhythm and normal heart sounds.  Exam reveals no gallop and no friction rub.   No murmur heard. Pulmonary/Chest: Effort normal and breath sounds normal. No respiratory distress. She has no wheezes. She has no rales.  Abdominal: Soft. Bowel sounds are normal. There is no tenderness.  Musculoskeletal: Normal range of  motion.  Neurological: She is alert and oriented to person, place, and time.  Skin: Skin is warm and dry.  On the lower abdomen there are two healing excoriations midline abdomen.   Psychiatric: She has a normal mood and affect. Her behavior is normal.    PREVNAR-13 AND INFLUENZA VACCINES ADMINISTERED.  Assessment & Plan:   1. Iron deficiency anemia: improved at last visit; stopped iron nine month ago. 2.  RLS: uncontrolled; stopped Requip since last visit. 3.  OSA on CPAP: controlled with good compliance with CPAP usage; continue to use CPAP qhs; improved energy during the day with less hypersomnolence.  Clear benefit to CPAP usage.  100% daily compliance with CPAP with 87% days usage greater than four hours per night.   4.  Macular degeneration: stable; recent follow-up with ophthalmology. 5.  S/p Prevnar -13 6. s /p influenza vaccine.   No orders of the defined types were placed in this encounter.    No Follow-up on file.   I personally performed the services described in this documentation, which was scribed in my presence.  The recorded information has been reviewed and is accurate.   Reginia Forts, M.D.  [facility] [address] [place], [place]  [address] [phone] phone [phone] fax

## 2013-11-08 ENCOUNTER — Telehealth: Payer: Self-pay

## 2013-11-08 NOTE — Telephone Encounter (Signed)
Patient is needing [place] to fax over the information from her last visit with Dr Tamala Julian regarding her CPAP.   Attn: [facility] in [place]

## 2013-11-09 NOTE — Telephone Encounter (Signed)
Faxed information to [facility] in [place]

## 2013-12-08 LAB — CULTURE, FUNGUS WITHOUT SMEAR

## 2013-12-11 ENCOUNTER — Other Ambulatory Visit: Payer: Self-pay | Admitting: Family Medicine

## 2013-12-13 ENCOUNTER — Encounter: Payer: Self-pay | Admitting: Family Medicine

## 2013-12-13 ENCOUNTER — Ambulatory Visit (INDEPENDENT_AMBULATORY_CARE_PROVIDER_SITE_OTHER): Payer: Medicare Other | Admitting: Family Medicine

## 2013-12-13 VITALS — BP 146/78 | HR 83 | Temp 98.6°F | Resp 16 | Ht 62.0 in | Wt 242.6 lb

## 2013-12-13 DIAGNOSIS — Z0000 Encounter for general adult medical examination without abnormal findings: Secondary | ICD-10-CM | POA: Diagnosis not present

## 2013-12-13 DIAGNOSIS — Z131 Encounter for screening for diabetes mellitus: Secondary | ICD-10-CM | POA: Diagnosis not present

## 2013-12-13 DIAGNOSIS — F32A Depression, unspecified: Secondary | ICD-10-CM

## 2013-12-13 DIAGNOSIS — G4733 Obstructive sleep apnea (adult) (pediatric): Secondary | ICD-10-CM

## 2013-12-13 DIAGNOSIS — Z124 Encounter for screening for malignant neoplasm of cervix: Secondary | ICD-10-CM | POA: Diagnosis not present

## 2013-12-13 DIAGNOSIS — Z01419 Encounter for gynecological examination (general) (routine) without abnormal findings: Secondary | ICD-10-CM | POA: Diagnosis not present

## 2013-12-13 DIAGNOSIS — N3941 Urge incontinence: Secondary | ICD-10-CM | POA: Diagnosis not present

## 2013-12-13 DIAGNOSIS — D509 Iron deficiency anemia, unspecified: Secondary | ICD-10-CM

## 2013-12-13 DIAGNOSIS — F329 Major depressive disorder, single episode, unspecified: Secondary | ICD-10-CM

## 2013-12-13 DIAGNOSIS — N3281 Overactive bladder: Secondary | ICD-10-CM

## 2013-12-13 DIAGNOSIS — G2581 Restless legs syndrome: Secondary | ICD-10-CM | POA: Diagnosis not present

## 2013-12-13 DIAGNOSIS — Z9989 Dependence on other enabling machines and devices: Secondary | ICD-10-CM

## 2013-12-13 LAB — CBC WITH DIFFERENTIAL/PLATELET
BASOS ABS: 0.1 10*3/uL (ref 0.0–0.1)
BASOS PCT: 1 % (ref 0–1)
EOS ABS: 0.1 10*3/uL (ref 0.0–0.7)
EOS PCT: 2 % (ref 0–5)
HCT: 39.8 % (ref 36.0–46.0)
Hemoglobin: 13.2 g/dL (ref 12.0–15.0)
Lymphocytes Relative: 39 % (ref 12–46)
Lymphs Abs: 2.5 10*3/uL (ref 0.7–4.0)
MCH: 27.4 pg (ref 26.0–34.0)
MCHC: 33.2 g/dL (ref 30.0–36.0)
MCV: 82.6 fL (ref 78.0–100.0)
MONO ABS: 0.4 10*3/uL (ref 0.1–1.0)
Monocytes Relative: 6 % (ref 3–12)
Neutro Abs: 3.3 10*3/uL (ref 1.7–7.7)
Neutrophils Relative %: 52 % (ref 43–77)
PLATELETS: 311 10*3/uL (ref 150–400)
RBC: 4.82 MIL/uL (ref 3.87–5.11)
RDW: 15 % (ref 11.5–15.5)
WBC: 6.4 10*3/uL (ref 4.0–10.5)

## 2013-12-13 LAB — POCT URINALYSIS DIPSTICK
Bilirubin, UA: NEGATIVE
Glucose, UA: NEGATIVE
Ketones, UA: NEGATIVE
Nitrite, UA: NEGATIVE
PH UA: 6.5
Protein, UA: NEGATIVE
Spec Grav, UA: 1.015
UROBILINOGEN UA: 0.2

## 2013-12-13 LAB — COMPLETE METABOLIC PANEL WITH GFR
ALBUMIN: 4 g/dL (ref 3.5–5.2)
ALK PHOS: 97 U/L (ref 39–117)
ALT: 15 U/L (ref 0–35)
AST: 18 U/L (ref 0–37)
BUN: 15 mg/dL (ref 6–23)
CALCIUM: 9.5 mg/dL (ref 8.4–10.5)
CHLORIDE: 105 meq/L (ref 96–112)
CO2: 29 mEq/L (ref 19–32)
Creat: 0.69 mg/dL (ref 0.50–1.10)
GFR, Est African American: >89 mL/min
GFR, Est Non African American: >89 mL/min
Glucose, Bld: 74 mg/dL (ref 70–99)
POTASSIUM: 4 meq/L (ref 3.5–5.3)
SODIUM: 141 meq/L (ref 135–145)
TOTAL PROTEIN: 7.2 g/dL (ref 6.0–8.3)
Total Bilirubin: 0.6 mg/dL (ref 0.2–1.2)

## 2013-12-13 MED ORDER — CITALOPRAM HYDROBROMIDE 40 MG PO TABS: ORAL_TABLET | ORAL | Status: DC

## 2013-12-13 MED ORDER — OXYBUTYNIN CHLORIDE ER 10 MG PO TB24: 10.0000 mg | ORAL_TABLET | Freq: Every day | ORAL | Status: DC

## 2013-12-13 NOTE — Progress Notes (Signed)
   Subjective:    Patient ID: Faith Thompson, female    DOB: 05/03/1945, 68 y.o.   MRN: [id number]  HPI    Review of Systems  Constitutional: Negative.   HENT: Negative.   Eyes: Negative.   Respiratory: Negative.   Cardiovascular: Negative.   Gastrointestinal: Negative.   Endocrine: Negative.   Genitourinary: Negative.   Musculoskeletal: Negative.   Skin: Negative.   Allergic/Immunologic: Negative.   Neurological: Negative.   Hematological: Negative.   Psychiatric/Behavioral: Negative.        Objective:   Physical Exam        Assessment & Plan:

## 2013-12-13 NOTE — Patient Instructions (Signed)
[facility] MAMMOGRAM.  Keeping You Healthy  Get These Tests  Blood Pressure- Have your blood pressure checked by your healthcare provider at least once a year.  Normal blood pressure is 120/80.  Weight- Have your body mass index (BMI) calculated to screen for obesity.  BMI is a measure of body fat based on height and weight.  You can calculate your own BMI at [employer]  Cholesterol- Have your cholesterol checked every year.  Diabetes- Have your blood sugar checked every year if you have high blood pressure, high cholesterol, a family history of diabetes or if you are overweight.  Pap Smear- Have a pap smear every 1 to 3 years if you have been sexually active.  If you are older than 65 and recent pap smears have been normal you may not need additional pap smears.  In addition, if you have had a hysterectomy  For benign disease additional pap smears are not necessary.  Mammogram-Yearly mammograms are essential for early detection of breast cancer  Screening for Colon Cancer- Colonoscopy starting at age 13. Screening may begin sooner depending on your family history and other health conditions.  Follow up colonoscopy as directed by your Gastroenterologist.  Screening for Osteoporosis- Screening begins at age 104 with bone density scanning, sooner if you are at higher risk for developing Osteoporosis.  Get these medicines  Calcium with Vitamin D- Your body requires 1200-1500 mg of Calcium a day and [phone] IU of Vitamin D a day.  You can only absorb 500 mg of Calcium at a time therefore Calcium must be taken in 2 or 3 separate doses throughout the day.  Hormones- Hormone therapy has been associated with increased risk for certain cancers and heart disease.  Talk to your healthcare provider about if you need relief from menopausal symptoms.  Aspirin- Ask your healthcare provider about taking Aspirin to prevent Heart Disease and Stroke.  Get these  Immuniztions  Flu shot- Every fall  Pneumonia shot- Once after the age of 57; if you are younger ask your healthcare provider if you need a pneumonia shot.  Tetanus- Every ten years.  Zostavax- Once after the age of 65 to prevent shingles.  Take these steps  Don't smoke- Your healthcare provider can help you quit. For tips on how to quit, ask your healthcare provider or go to www.smokefree.gov or call 1-800 QUIT-NOW.  Be physically active- Exercise 5 days a week for a minimum of 30 minutes.  If you are not already physically active, start slow and gradually work up to 30 minutes of moderate physical activity.  Try walking, dancing, bike riding, swimming, etc.  Eat a healthy diet- Eat a variety of healthy foods such as fruits, vegetables, whole grains, low fat milk, low fat cheeses, yogurt, lean meats, chicken, fish, eggs, dried beans, tofu, etc.  For more information go to www.thenutritionsource.org  Dental visit- Brush and floss teeth twice daily; visit your dentist twice a year.  Eye exam- Visit your Optometrist or Ophthalmologist yearly.  Drink alcohol in moderation- Limit alcohol intake to one drink or less a day.  Never drink and drive.  Depression- Your emotional health is as important as your physical health.  If you're feeling down or losing interest in things you normally enjoy, please talk to your healthcare provider.  Seat Belts- can save your life; always wear one  Smoke/Carbon Monoxide detectors- These detectors need to be installed on the appropriate level of your home.  Replace batteries at  least once a year.  Violence- If anyone is threatening or hurting you, please tell your healthcare provider.  Living Will/ Health care power of attorney- Discuss with your healthcare provider and family.

## 2013-12-13 NOTE — Addendum Note (Signed)
Addended by: Wardell Honour on: 12/13/2013 10:38 AM   Modules accepted: Orders

## 2013-12-13 NOTE — Progress Notes (Signed)
Subjective:    Patient ID: Faith Thompson, female    DOB: 1945-04-08, 68 y.o.   MRN: [id number]  12/13/2013  Annual Exam   HPI This 68 y.o. female presents for evaluation for Annual Wellness Exam Subsequent.    Last physical:  08/24/2012. Pap smear:  11/18/2011 Mammogram:  12/28/2012 Colonoscopy: 06/2012; repeat in 5 years. Bone density:  Years ago.  Not interested in repeat; declines today. TDAP:  2008 Pneumovax:  2010; Prevnar-13 10/2013 Zostavax:   2008 Influenza:  10/25/2013 Eye exam:  +glasses; 08/2013.  +cataracts; scheduled for surgery in 02/2014.   Dental exam:    Every six months.  Due in November.  Anemia iron deficiency: stopped iron supplement months ago.  Last H/H normal.  No bloody stools or melanotic stools.  No n/v/d/c; no abdominal pain.  Depression with anxiety: stable; coping well with family stressors; grandson has been cut off by parents; now having to pay for school and personal expenses.  Jarrett Soho is doing well.  Laurell Roof and Brooke Bonito are doing well.  Patient reports good compliance with medication, good tolerance to medication, and good symptom control.  Needs refill.  OSA: compliance with CPAP.  Goes to bed late around 12:00 or 1:00am; awakens 7:00 or 8:00am.  Energy level is good.  Urge incontinence/OAB: stable on Ditropan XL 10mg  daily; is halfing tablets due to dry mouth; less dry mouth.  Nocturia x 0.  Rare leaking during the day unless has waited too long to urinate.   Gastritis: stable; gets refills from GI physician.  Patient reports good compliance with medication, good tolerance to medication, and good symptom control.    Review of Systems  Constitutional: Negative for fever, chills, diaphoresis, activity change, appetite change, fatigue and unexpected weight change.  HENT: Negative for congestion, dental problem, drooling, ear discharge, ear pain, facial swelling, hearing loss, mouth sores, nosebleeds, postnasal drip, rhinorrhea, sinus pressure, sneezing, sore  throat, tinnitus, trouble swallowing and voice change.   Eyes: Negative for photophobia, pain, discharge, redness, itching and visual disturbance.  Respiratory: Negative for apnea, cough, choking, chest tightness, shortness of breath, wheezing and stridor.   Cardiovascular: Negative for chest pain, palpitations and leg swelling.  Gastrointestinal: Negative for nausea, vomiting, abdominal pain, diarrhea, constipation, blood in stool, abdominal distention, anal bleeding and rectal pain.  Endocrine: Negative for cold intolerance, heat intolerance, polydipsia, polyphagia and polyuria.  Genitourinary: Positive for urgency. Negative for dysuria, frequency, hematuria, flank pain, decreased urine volume, vaginal bleeding, vaginal discharge, enuresis, difficulty urinating, genital sores, vaginal pain, menstrual problem, pelvic pain and dyspareunia.  Musculoskeletal: Negative for arthralgias, back pain, gait problem, joint swelling, myalgias, neck pain and neck stiffness.  Skin: Negative for color change, pallor, rash and wound.  Allergic/Immunologic: Negative for environmental allergies, food allergies and immunocompromised state.  Neurological: Negative for dizziness, tremors, seizures, syncope, facial asymmetry, speech difficulty, weakness, light-headedness, numbness and headaches.  Hematological: Negative for adenopathy. Does not bruise/bleed easily.  Psychiatric/Behavioral: Negative for suicidal ideas, hallucinations, behavioral problems, confusion, sleep disturbance, self-injury, dysphoric mood, decreased concentration and agitation. The patient is not nervous/anxious and is not hyperactive.     Past Medical History  Diagnosis Date  . Anemia   . Asthma   . Depression   . Ulcer   . Overactive bladder   . Allergic rhinitis, cause unspecified   . GERD (gastroesophageal reflux disease)   . Factor V Leiden mutation   . Obesity, unspecified   . Dysthymic disorder   . Macular degeneration (senile) of  retina, unspecified   . Gastritis 11/24/2011    EGD: +gastritis; AVM gastric region, pulsatile mass in stomach  . 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  . 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  . Basal cell carcinoma 06/09/12    R facial region; s/p Mohs procedure at [facility].  . OSA on CPAP 05/24/2012    sleep study +OSA; CPAP titration placed.   Past Surgical History  Procedure Laterality Date  . Tonsillectomy  age 71  . Cholecystectomy  1993  . Abdominal hysterectomy  1996    fibroids ovaries removed  . Colonoscopy  10/2011    AVM bleeding in colon s/p cauterization.  Symptoms: anemia, hemoccult +.  . Esophagogastroduodenoscopy  10/2011    gastritis, AVM of stomach non-bleeding, pulsatile mass of stomach.  Symptoms: iron defficiency anemia with hemocult +.  . Esophagogastroduodenoscopy  [id number]    NL esophagus.-Bile gastritis.This was biopsied, pulsatile extrinsic mass at the bulb.A few non bleeding angioectasias in the stomach. Sucralfate tab at 1 gram po 2x day indefintely. repeat upper endoscopy prn for surveillance. Schedule an ultrasound of the abdomen to look for aneurysm of gastroduodenal artery.  . Sleep study  05/24/2012    +OSA; CPAP fitted.   Allergies  Allergen Reactions  . Codeine Itching  . Adhesive [Tape] Rash    Any tape - paper, bandaid   Current Outpatient Prescriptions  Medication Sig Dispense Refill  . albuterol (PROAIR HFA) 108 (90 BASE) MCG/ACT inhaler 1 OR 2 PUFFS BY INHALATION PRIOR TO EXERCISE & Q6hPRN      . budesonide-formoterol (SYMBICORT) 160-4.5 MCG/ACT inhaler Inhale 1 puff into the lungs 2 (two) times daily.      . calcium carbonate (OS-CAL) 600 MG TABS Take 600 mg by mouth 2 (two) times daily with a meal.      . cetirizine (ZYRTEC) 10 MG tablet Take 10 mg by mouth daily.      . citalopram (CELEXA) 40 MG  tablet 1 tablet daily  90 tablet  3  . fish oil-omega-3 fatty acids 1000 MG capsule Take 2 g by mouth daily.      . Lutein 6 MG CAPS Take by mouth daily.      Marland Kitchen omeprazole (PRILOSEC) 20 MG capsule TAKE ONE CAPSULE BY MOUTH DAILY  30 capsule  5  . OVER THE COUNTER MEDICATION Redcaps for eyes      . sucralfate (CARAFATE) 1 G tablet Take 1 g by mouth 2 (two) times daily.      Marland Kitchen oxybutynin (DITROPAN-XL) 10 MG 24 hr tablet Take 1 tablet (10 mg total) by mouth at bedtime.  90 tablet  3   No current facility-administered medications for this visit.       Objective:    BP 146/78  Pulse 83  Temp(Src) 98.6 F (37 C) (Oral)  Resp 16  Ht 5\' 2"  (1.575 m)  Wt 242 lb 9.6 oz (110.043 kg)  BMI 44.36 kg/m2  SpO2 98% Physical Exam  Nursing note and vitals reviewed. Constitutional: She is oriented to person, place, and time. She appears well-developed and well-nourished. No distress.  HENT:  Head: Normocephalic and atraumatic.  Right Ear: External ear normal.  Left Ear: External ear normal.  Nose: Nose normal.  Mouth/Throat: Oropharynx is clear and moist.  Eyes: Conjunctivae and EOM are normal. Pupils are equal, round, and reactive to light.  Neck: Normal range of motion and full passive range of motion without pain. Neck supple. No JVD present. Carotid bruit is not present. No thyromegaly present.  Cardiovascular: Normal rate, regular rhythm and normal heart sounds.  Exam reveals no gallop and no friction rub.   No murmur heard. Pulmonary/Chest: Effort normal and breath sounds normal. She has no wheezes. She has no rales. Right breast exhibits no inverted nipple, no mass, no nipple discharge, no skin change and no tenderness. Left breast exhibits no inverted nipple, no mass, no nipple discharge, no skin change and no tenderness. Breasts are symmetrical.  Abdominal: Soft. Bowel sounds are normal. She exhibits no distension and no mass. There is no tenderness. There is no rebound and no guarding.    Genitourinary: Vagina normal. There is no rash, tenderness, lesion or injury on the right labia. There is no rash, tenderness, lesion or injury on the left labia.  Musculoskeletal:       Right shoulder: Normal.       Left shoulder: Normal.       Cervical back: Normal.  Lymphadenopathy:    She has no cervical adenopathy.  Neurological: She is alert and oriented to person, place, and time. She has normal reflexes. No cranial nerve deficit. She exhibits normal muscle tone. Coordination normal.  Skin: Skin is warm and dry. No rash noted. She is not diaphoretic. No erythema. No pallor.  Psychiatric: She has a normal mood and affect. Her behavior is normal. Judgment and thought content normal.        Assessment & Plan:   1. Routine general medical examination at a health care facility   2. Iron deficiency anemia   3. Screening for diabetes mellitus   4. Urge incontinence of urine   5. Restless leg syndrome   6. Overactive bladder   7. OSA on CPAP   8. Depression   9. Encounter for gynecological examination     1.  Medicare Annual Wellness Exam:  Anticipatory guidance --- exercise, weight loss.  Pap smear obtained; pt to schedule mammogram.  Colonoscopy UTD.  Pt declined bone density scan.  Immunizations UTD. Low fall risk. Independent with ADLs.  Undergoing treatment for anxiety/depression. Undergoing treatment for OAB/urge incontinence. 2.  Gynecological exam: pap smear obtained; pt to schedule mammogram; s/p hysterectomy with B oophorectomy for fibroids at age 15.   3.  Iron deficiency anemia: improved; obtain CBC; no longer taking iron supplement. 4.  Screening DMII: obtain glucose. 5.  Urge Incontinence/Overactive Bladder: controlled; refill of Ditropan XL 10mg  1/2 tablet daily provided. 6.  Depression with anxiety: controlled; refill of Celexa 40mg  daily provided.   7.  OSA on CPAP: controlled. 8. Obesity: persistent; encourage weight loss and regular exercise.    Meds ordered  this encounter  Medications  . citalopram (CELEXA) 40 MG tablet    Sig: 1 tablet daily    Dispense:  90 tablet    Refill:  3  . oxybutynin (DITROPAN-XL) 10 MG 24 hr tablet    Sig: Take 1 tablet (10 mg total) by mouth at bedtime.    Dispense:  90 tablet    Refill:  3    No Follow-up on file.    Reginia Forts, M.D.  [facility] [address] [place], [place]  [address] [phone] phone [phone] fax

## 2013-12-14 LAB — PAP IG (IMAGE GUIDED)

## 2013-12-27 ENCOUNTER — Encounter: Payer: Self-pay | Admitting: Family Medicine

## 2014-01-02 ENCOUNTER — Telehealth: Payer: Self-pay

## 2014-01-02 DIAGNOSIS — Z9989 Dependence on other enabling machines and devices: Principal | ICD-10-CM

## 2014-01-02 DIAGNOSIS — G4733 Obstructive sleep apnea (adult) (pediatric): Secondary | ICD-10-CM

## 2014-01-02 NOTE — Telephone Encounter (Signed)
Please see e-mail and fax to [facility]    [phone]

## 2014-01-02 NOTE — Telephone Encounter (Signed)
1. Please fax copy of Sleep Study to [facility].  2.  Also fax an order for CPAP with mask and supplies to [facility].  3.  Please also fax a letter stating that patient benefits from CPAP treatment due to OSA as demonstrated by sleep study.

## 2014-01-02 NOTE — Telephone Encounter (Signed)
Order has been created. Letter has been written and Sleep Study has been printed. All these items faxed to [facility]- [phone]  Pt has been advised.

## 2014-01-11 ENCOUNTER — Other Ambulatory Visit: Payer: Self-pay | Admitting: Family Medicine

## 2014-02-05 ENCOUNTER — Ambulatory Visit: Payer: Self-pay | Admitting: Family Medicine

## 2014-02-05 DIAGNOSIS — Z1231 Encounter for screening mammogram for malignant neoplasm of breast: Secondary | ICD-10-CM | POA: Diagnosis not present

## 2014-02-05 LAB — HM MAMMOGRAPHY

## 2014-04-05 DIAGNOSIS — H269 Unspecified cataract: Secondary | ICD-10-CM | POA: Diagnosis not present

## 2014-04-05 DIAGNOSIS — H3531 Nonexudative age-related macular degeneration: Secondary | ICD-10-CM | POA: Diagnosis not present

## 2014-06-13 ENCOUNTER — Ambulatory Visit (INDEPENDENT_AMBULATORY_CARE_PROVIDER_SITE_OTHER): Payer: Medicare Other | Admitting: Family Medicine

## 2014-06-13 ENCOUNTER — Encounter: Payer: Self-pay | Admitting: Family Medicine

## 2014-06-13 VITALS — BP 130/84 | HR 74 | Temp 98.3°F | Resp 16 | Ht 62.7 in | Wt 254.4 lb

## 2014-06-13 DIAGNOSIS — N952 Postmenopausal atrophic vaginitis: Secondary | ICD-10-CM

## 2014-06-13 DIAGNOSIS — F329 Major depressive disorder, single episode, unspecified: Secondary | ICD-10-CM | POA: Diagnosis not present

## 2014-06-13 DIAGNOSIS — E669 Obesity, unspecified: Secondary | ICD-10-CM

## 2014-06-13 DIAGNOSIS — N3941 Urge incontinence: Secondary | ICD-10-CM

## 2014-06-13 DIAGNOSIS — G4733 Obstructive sleep apnea (adult) (pediatric): Secondary | ICD-10-CM

## 2014-06-13 DIAGNOSIS — F32A Depression, unspecified: Secondary | ICD-10-CM

## 2014-06-13 DIAGNOSIS — G2581 Restless legs syndrome: Secondary | ICD-10-CM

## 2014-06-13 DIAGNOSIS — F410 Panic disorder [episodic paroxysmal anxiety] without agoraphobia: Secondary | ICD-10-CM

## 2014-06-13 DIAGNOSIS — G4700 Insomnia, unspecified: Secondary | ICD-10-CM | POA: Diagnosis not present

## 2014-06-13 DIAGNOSIS — N3281 Overactive bladder: Secondary | ICD-10-CM

## 2014-06-13 DIAGNOSIS — K2970 Gastritis, unspecified, without bleeding: Secondary | ICD-10-CM

## 2014-06-13 DIAGNOSIS — Z9989 Dependence on other enabling machines and devices: Secondary | ICD-10-CM

## 2014-06-13 LAB — COMPREHENSIVE METABOLIC PANEL
ALT: 15 U/L (ref 0–35)
AST: 19 U/L (ref 0–37)
Albumin: 3.8 g/dL (ref 3.5–5.2)
Alkaline Phosphatase: 85 U/L (ref 39–117)
BUN: 9 mg/dL (ref 6–23)
CALCIUM: 9.3 mg/dL (ref 8.4–10.5)
CO2: 25 mEq/L (ref 19–32)
Chloride: 105 mEq/L (ref 96–112)
Creat: 0.6 mg/dL (ref 0.50–1.10)
GLUCOSE: 84 mg/dL (ref 70–99)
POTASSIUM: 4.1 meq/L (ref 3.5–5.3)
SODIUM: 141 meq/L (ref 135–145)
Total Bilirubin: 0.6 mg/dL (ref 0.2–1.2)
Total Protein: 6.7 g/dL (ref 6.0–8.3)

## 2014-06-13 LAB — CBC WITH DIFFERENTIAL/PLATELET
Basophils Absolute: 0.1 10*3/uL (ref 0.0–0.1)
Basophils Relative: 1 % (ref 0–1)
Eosinophils Absolute: 0.1 10*3/uL (ref 0.0–0.7)
Eosinophils Relative: 2 % (ref 0–5)
HEMATOCRIT: 36.9 % (ref 36.0–46.0)
HEMOGLOBIN: 12.3 g/dL (ref 12.0–15.0)
LYMPHS ABS: 2.5 10*3/uL (ref 0.7–4.0)
Lymphocytes Relative: 38 % (ref 12–46)
MCH: 27.4 pg (ref 26.0–34.0)
MCHC: 33.3 g/dL (ref 30.0–36.0)
MCV: 82.2 fL (ref 78.0–100.0)
MONOS PCT: 5 % (ref 3–12)
MPV: 9.6 fL (ref 8.6–12.4)
Monocytes Absolute: 0.3 10*3/uL (ref 0.1–1.0)
NEUTROS ABS: 3.6 10*3/uL (ref 1.7–7.7)
Neutrophils Relative %: 54 % (ref 43–77)
Platelets: 279 10*3/uL (ref 150–400)
RBC: 4.49 MIL/uL (ref 3.87–5.11)
RDW: 15.7 % — ABNORMAL HIGH (ref 11.5–15.5)
WBC: 6.7 10*3/uL (ref 4.0–10.5)

## 2014-06-13 MED ORDER — ESTRADIOL 10 MCG VA TABS: 1.0000 | ORAL_TABLET | VAGINAL | Status: DC

## 2014-06-13 MED ORDER — SERTRALINE HCL 50 MG PO TABS: 50.0000 mg | ORAL_TABLET | Freq: Every day | ORAL | Status: DC

## 2014-06-13 NOTE — Patient Instructions (Signed)
1.  Decrease Citalopram to 1/2 tablet daily for two weeks and then 1/2 tablet every other day for one week then stop. 2. Start Sertraline 50mg  one tablet at night today.   Generalized Anxiety Disorder Generalized anxiety disorder (GAD) is a mental disorder. It interferes with life functions, including relationships, work, and school. GAD is different from normal anxiety, which everyone experiences at some point in their lives in response to specific life events and activities. Normal anxiety actually helps [place] prepare for and get through these life events and activities. Normal anxiety goes away after the event or activity is over.  GAD causes anxiety that is not necessarily related to specific events or activities. It also causes excess anxiety in proportion to specific events or activities. The anxiety associated with GAD is also difficult to control. GAD can vary from mild to severe. People with severe GAD can have intense waves of anxiety with physical symptoms (panic attacks).  SYMPTOMS The anxiety and worry associated with GAD are difficult to control. This anxiety and worry are related to many life events and activities and also occur more days than not for 6 months or longer. People with GAD also have three or more of the following symptoms (one or more in children):  Restlessness.   Fatigue.  Difficulty concentrating.   Irritability.  Muscle tension.  Difficulty sleeping or unsatisfying sleep. DIAGNOSIS GAD is diagnosed through an assessment by your health care provider. Your health care provider will ask you questions aboutyour mood,physical symptoms, and events in your life. Your health care provider may ask you about your medical history and use of alcohol or drugs, including prescription medicines. Your health care provider may also do a physical exam and blood tests. Certain medical conditions and the use of certain substances can cause symptoms similar to those associated  with GAD. Your health care provider may refer you to a mental health specialist for further evaluation. TREATMENT The following therapies are usually used to treat GAD:   Medication. Antidepressant medication usually is prescribed for long-term daily control. Antianxiety medicines may be added in severe cases, especially when panic attacks occur.   Talk therapy (psychotherapy). Certain types of talk therapy can be helpful in treating GAD by providing support, education, and guidance. A form of talk therapy called cognitive behavioral therapy can teach you healthy ways to think about and react to daily life events and activities.  Stress managementtechniques. These include yoga, meditation, and exercise and can be very helpful when they are practiced regularly. A mental health specialist can help determine which treatment is best for you. Some people see improvement with one therapy. However, other people require a combination of therapies. Document Released: 06/06/2012 Document Revised: 06/26/2013 Document Reviewed: 06/06/2012 [facility] Patient Information 2015 [place], [place]. This information is not intended to replace advice given to you by your health care provider. Make sure you discuss any questions you have with your health care provider.

## 2014-06-13 NOTE — Progress Notes (Signed)
Subjective:    Patient ID: Faith Thompson, female    DOB: Sep 25, 1945, 69 y.o.   MRN: [id number]  06/13/2014  Follow-up and wants handicap placecard   HPI This 69 y.o. female presents for six month follow-up of the following:  1.  Anxiety and depression:  No changes to management made at last visit.  Patient reports good compliance with medication, good tolerance to medication, and good symptom control.    Started having panic attacks in 03/2014.  Today is a very anxious day.  Everything is fine; Cori Razor had an incident; his niece passed away last week.  Has been so fidgety.  Cori Razor is doing great; doing [employer] work.  Going on a lot of antique shopping; spending a lot of time together.  Going to [place] in December cruise.  No weight loss medications; no supplements.  Husband takes good care of me.  Waiting for shoe to drop; mother-in-law turned 44 years old.  Every night is bad; ten pm is horrible for anxiety.  Taking Citalopram 72m daily.  No previous medications other than Citalopram.  2.  Asthma:  No recent issues. Has not taken Symbicort in years. Rare Albuterol use.  3. Allergic Rhinitis: stable; no recent issues.  4. GERD:  Patient reports good compliance with medication, good tolerance to medication, and good symptom control.  Denies n/v/d/c; denies bloody stools or melena; denies abdominal pain.   Taking Carafate three times per day; started three months ago. Called GI physician when having issues.   5.  Overactive bladder/urge incontinence:  Patient reports good compliance with medication, good tolerance to medication, and good symptom control.  Some rare leakage; wears a pad most days.  6.  OSA on CPAP with RLS:  Patient reports good compliance with medication, good tolerance to medication, and good symptom control.  Using CPAP qhs.  7. Macular degeneration: vision is worsening due to macular degeneration.  Seeing better without glasses.  Not worried about this.   8. OA knees B:  s/p knee doctor; recommended weight loss.  Has not lost weight.  Anxiety causes lack of motivation.  Recommended new knee cap; not sure if wants to undergo surgery.  Requesting handicap sticker due to knee pain.   9. Vaginal dryness:  Horrible; factor V deficiency so HRT stopped years ago.  Cracking and bleeding at time.  Requesting topical treatment.  Review of Systems  Constitutional: Negative for fever, chills, diaphoresis and fatigue.  HENT: Negative for congestion, ear discharge, ear pain, postnasal drip, rhinorrhea, sinus pressure, sneezing and sore throat.   Eyes: Negative for visual disturbance.  Respiratory: Negative for cough and shortness of breath.   Cardiovascular: Negative for chest pain, palpitations and leg swelling.  Gastrointestinal: Negative for nausea, vomiting, abdominal pain, diarrhea and constipation.  Endocrine: Negative for cold intolerance, heat intolerance, polydipsia, polyphagia and polyuria.  Musculoskeletal: Positive for joint swelling, arthralgias and gait problem.  Neurological: Negative for dizziness, tremors, seizures, syncope, facial asymmetry, speech difficulty, weakness, light-headedness, numbness and headaches.  Psychiatric/Behavioral: Positive for sleep disturbance. Negative for suicidal ideas, self-injury and dysphoric mood. The patient is nervous/anxious.     Past Medical History  Diagnosis Date  . Anemia   . Asthma   . Depression   . Ulcer   . Overactive bladder   . Allergic rhinitis, cause unspecified   . GERD (gastroesophageal reflux disease)   . Factor V Leiden mutation   . Obesity, unspecified   . Dysthymic disorder   . Macular degeneration (senile)  of retina, unspecified   . Gastritis 11/24/2011    EGD: +gastritis; AVM gastric region, pulsatile mass in stomach  . 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  . 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  . Basal cell carcinoma 06/09/12    R facial region; s/p Mohs procedure at [facility].  . OSA on CPAP 05/24/2012    sleep study +OSA; CPAP titration placed.   Past Surgical History  Procedure Laterality Date  . Tonsillectomy  age 69  . Cholecystectomy  1993  . Abdominal hysterectomy  1996    fibroids ovaries removed  . Colonoscopy  10/2011    AVM bleeding in colon s/p cauterization.  Symptoms: anemia, hemoccult +.  . Esophagogastroduodenoscopy  10/2011    gastritis, AVM of stomach non-bleeding, pulsatile mass of stomach.  Symptoms: iron defficiency anemia with hemocult +.  . Esophagogastroduodenoscopy  [id number]    NL esophagus.-Bile gastritis.This was biopsied, pulsatile extrinsic mass at the bulb.A few non bleeding angioectasias in the stomach. Sucralfate tab at 1 gram po 2x day indefintely. repeat upper endoscopy prn for surveillance. Schedule an ultrasound of the abdomen to look for aneurysm of gastroduodenal artery.  . Sleep study  05/24/2012    +OSA; CPAP fitted.   Allergies  Allergen Reactions  . Codeine Itching  . Adhesive [Tape] Rash    Any tape - paper, bandaid        Objective:    BP 130/84 mmHg  Pulse 74  Temp(Src) 98.3 F (36.8 C) (Oral)  Resp 16  Ht 5' 2.7" (1.593 m)  Wt 254 lb 6.4 oz (115.395 kg)  BMI 45.47 kg/m2  SpO2 98% Physical Exam  Constitutional: She is oriented to person, place, and time. She appears well-developed and well-nourished. No distress.  obese  HENT:  Head: Normocephalic and atraumatic.  Right Ear: External ear normal.  Left Ear: External ear normal.  Nose: Nose normal.  Mouth/Throat: Oropharynx is clear and moist.  Eyes: Conjunctivae and EOM are normal. Pupils are equal, round, and reactive to light.  Neck: Normal range of motion. Neck supple. Carotid bruit is not present. No thyromegaly present.  Cardiovascular: Normal rate, regular rhythm, normal heart sounds and intact distal pulses.  Exam  reveals no gallop and no friction rub.   No murmur heard. Pulmonary/Chest: Effort normal and breath sounds normal. She has no wheezes. She has no rales.  Abdominal: Soft. Bowel sounds are normal. She exhibits no distension and no mass. There is no tenderness. There is no rebound and no guarding.  Lymphadenopathy:    She has no cervical adenopathy.  Neurological: She is alert and oriented to person, place, and time. No cranial nerve deficit.  Skin: Skin is warm and dry. No rash noted. She is not diaphoretic. No erythema. No pallor.  Psychiatric: She has a normal mood and affect. Her behavior is normal.   Results for orders placed or performed in visit on 06/13/14  CBC with Differential/Platelet  Result Value Ref Range   WBC 6.7 4.0 - 10.5 K/uL   RBC 4.49 3.87 - 5.11 MIL/uL   Hemoglobin 12.3 12.0 - 15.0 g/dL   HCT 36.9 36.0 - 46.0 %   MCV 82.2 78.0 - 100.0 fL   MCH 27.4 26.0 - 34.0 pg   MCHC 33.3 30.0 - 36.0 g/dL   RDW 15.7 (H) 11.5 - 15.5 %   Platelets  279 150 - 400 K/uL   MPV 9.6 8.6 - 12.4 fL   Neutrophils Relative % 54 43 - 77 %   Neutro Abs 3.6 1.7 - 7.7 K/uL   Lymphocytes Relative 38 12 - 46 %   Lymphs Abs 2.5 0.7 - 4.0 K/uL   Monocytes Relative 5 3 - 12 %   Monocytes Absolute 0.3 0.1 - 1.0 K/uL   Eosinophils Relative 2 0 - 5 %   Eosinophils Absolute 0.1 0.0 - 0.7 K/uL   Basophils Relative 1 0 - 1 %   Basophils Absolute 0.1 0.0 - 0.1 K/uL   Smear Review Criteria for review not met   Comprehensive metabolic panel  Result Value Ref Range   Sodium 141 135 - 145 mEq/L   Potassium 4.1 3.5 - 5.3 mEq/L   Chloride 105 96 - 112 mEq/L   CO2 25 19 - 32 mEq/L   Glucose, Bld 84 70 - 99 mg/dL   BUN 9 6 - 23 mg/dL   Creat 0.60 0.50 - 1.10 mg/dL   Total Bilirubin 0.6 0.2 - 1.2 mg/dL   Alkaline Phosphatase 85 39 - 117 U/L   AST 19 0 - 37 U/L   ALT 15 0 - 35 U/L   Total Protein 6.7 6.0 - 8.3 g/dL   Albumin 3.8 3.5 - 5.2 g/dL   Calcium 9.3 8.4 - 10.5 mg/dL       Assessment &  Plan:   1. Urge incontinence of urine   2. Restless leg syndrome   3. Overactive bladder   4. OSA on CPAP   5. Obesity   6. Insomnia   7. Gastritis   8. Depression   9. Atrophic vaginitis   10. Panic attack    1.Overactive bladder/urge incontinence: controlled; continue medication at current dose. 2.  RLS; uncontrolled; not interfering with sleep.  Refuses treatment. 3.  OSA on CPAP: stable; compliance with CPAP. 4.  Obesity: persistent; not motivated to lose weight. 5.  Gastritis/GERD: controlled; no changes to management; recently had to add Carafate for worsening symptoms. 6.  Depression with new onset panic attacks: New.  Wean Citalopram; start Zoloft 56m daily; RTC three months. 7.  Atrophic Vaginitis: New; rx for Vagifem 182m 1 tablet PV bid.   Meds ordered this encounter  Medications  . sertraline (ZOLOFT) 50 MG tablet    Sig: Take 1 tablet (50 mg total) by mouth daily.    Dispense:  30 tablet    Refill:  3  . Estradiol 10 MCG TABS vaginal tablet    Sig: Place 1 tablet (10 mcg total) vaginally 2 (two) times a week.    Dispense:  8 tablet    Refill:  11    Return in about 3 months (around 09/12/2014) for recheck panic attacks.     Kristi MaElayne GuerinM.D. [facility]  [phone] [phone]

## 2014-07-19 DIAGNOSIS — N898 Other specified noninflammatory disorders of vagina: Secondary | ICD-10-CM | POA: Diagnosis not present

## 2014-08-30 ENCOUNTER — Encounter: Payer: Self-pay | Admitting: Family Medicine

## 2014-08-30 ENCOUNTER — Other Ambulatory Visit: Payer: Self-pay

## 2014-08-30 MED ORDER — SERTRALINE HCL 50 MG PO TABS: 50.0000 mg | ORAL_TABLET | Freq: Every day | ORAL | Status: DC

## 2014-09-12 ENCOUNTER — Ambulatory Visit: Payer: Medicare Other | Admitting: Family Medicine

## 2014-09-24 ENCOUNTER — Encounter: Payer: Self-pay | Admitting: Family Medicine

## 2014-09-24 ENCOUNTER — Ambulatory Visit (INDEPENDENT_AMBULATORY_CARE_PROVIDER_SITE_OTHER): Payer: Medicare Other | Admitting: Family Medicine

## 2014-09-24 VITALS — BP 132/72 | HR 82 | Temp 98.3°F | Resp 16 | Ht 61.75 in | Wt 260.2 lb

## 2014-09-24 DIAGNOSIS — N939 Abnormal uterine and vaginal bleeding, unspecified: Secondary | ICD-10-CM

## 2014-09-24 DIAGNOSIS — R21 Rash and other nonspecific skin eruption: Secondary | ICD-10-CM | POA: Diagnosis not present

## 2014-09-24 DIAGNOSIS — F419 Anxiety disorder, unspecified: Secondary | ICD-10-CM

## 2014-09-24 DIAGNOSIS — F418 Other specified anxiety disorders: Secondary | ICD-10-CM

## 2014-09-24 DIAGNOSIS — F329 Major depressive disorder, single episode, unspecified: Secondary | ICD-10-CM

## 2014-09-24 DIAGNOSIS — N949 Unspecified condition associated with female genital organs and menstrual cycle: Secondary | ICD-10-CM

## 2014-09-24 LAB — POCT URINALYSIS DIPSTICK
BILIRUBIN UA: NEGATIVE
GLUCOSE UA: NEGATIVE
Ketones, UA: NEGATIVE
Nitrite, UA: NEGATIVE
Protein, UA: NEGATIVE
Spec Grav, UA: 1.015
Urobilinogen, UA: 0.2
pH, UA: 5.5

## 2014-09-24 LAB — POCT WET PREP WITH KOH
KOH PREP POC: NEGATIVE
RBC WET PREP PER HPF POC: NEGATIVE
Trichomonas, UA: NEGATIVE
WBC Wet Prep HPF POC: NEGATIVE

## 2014-09-24 LAB — POCT UA - MICROSCOPIC ONLY
Bacteria, U Microscopic: NEGATIVE
Casts, Ur, LPF, POC: NEGATIVE
Crystals, Ur, HPF, POC: NEGATIVE
Mucus, UA: NEGATIVE
Yeast, UA: NEGATIVE

## 2014-09-24 LAB — POCT SKIN KOH: SKIN KOH, POC: NEGATIVE

## 2014-09-24 MED ORDER — HYDROCORTISONE 2.5 % EX OINT: TOPICAL_OINTMENT | Freq: Two times a day (BID) | CUTANEOUS | Status: DC

## 2014-09-24 MED ORDER — TERCONAZOLE 0.4 % VA CREA: 1.0000 | TOPICAL_CREAM | Freq: Every day | VAGINAL | Status: DC

## 2014-09-24 NOTE — Progress Notes (Signed)
Subjective:    Patient ID: Faith Thompson, female    DOB: 05-Sep-1945, 69 y.o.   MRN: [id number]  09/24/2014  Follow-up; Panic attacks; itchy, hurt area on right arm; and vaginal bleeding   HPI This 69 y.o. female presents for three month follow-up:  1.  Anxiety and depression:  Switched Celexa to Zoloft 50mg  daily three months ago for worsening depression and development of panic attacks.  No recurrent panic attacks.  No sadness/depression symptoms.  Husband is really pleased with results.  Tolerating Zoloft 50mg  daily without side effects.    2.  Vaginal bleeding:  Used Estradiol 5mcg one pill twice weekly for vaginal dryness; completed course but did not refill medication; was very expensive.  S/p gynecology evaluation due to vaginal bleeding; saw Dr. Leonides Schanz at [facility]; treated for yeast infection.  Normal exam per gynecology.  Has suffered with intermittent vaginal spotting in past year.  Last episode was two spots of bleeding on 09/10/14.     3. Rash R arm: onset five days ago.  Isolated lesion; itches and hurts.  Whoel arm hurts.  Worried about shingles.  Applying Camfofanique.   Review of Systems  Constitutional: Negative for fever, chills, diaphoresis and fatigue.  HENT: Positive for congestion. Negative for ear pain, postnasal drip, rhinorrhea and sore throat.   Respiratory: Positive for cough. Negative for shortness of breath and wheezing.   Cardiovascular: Negative for chest pain, palpitations and leg swelling.  Genitourinary: Positive for vaginal bleeding and vaginal pain. Negative for dysuria, urgency, frequency, hematuria, flank pain, vaginal discharge, genital sores and pelvic pain.  Skin: Positive for color change and rash.  Psychiatric/Behavioral: Negative for suicidal ideas, sleep disturbance, self-injury and dysphoric mood. The patient is not nervous/anxious.     Past Medical History  Diagnosis Date  . Anemia   . Asthma   . Depression   . Ulcer   . Overactive  bladder   . Allergic rhinitis, cause unspecified   . GERD (gastroesophageal reflux disease)   . Factor V Leiden mutation   . Obesity, unspecified   . Dysthymic disorder   . Macular degeneration (senile) of retina, unspecified   . Gastritis 11/24/2011    EGD: +gastritis; AVM gastric region, pulsatile mass in stomach  . 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  . 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  . Basal cell carcinoma 06/09/12    R facial region; s/p Mohs procedure at [facility].  . OSA on CPAP 05/24/2012    sleep study +OSA; CPAP titration placed.   Past Surgical History  Procedure Laterality Date  . Tonsillectomy  age 68  . Cholecystectomy  1993  . Abdominal hysterectomy  1996    fibroids ovaries removed  . Colonoscopy  10/2011    AVM bleeding in colon s/p cauterization.  Symptoms: anemia, hemoccult +.  . Esophagogastroduodenoscopy  10/2011    gastritis, AVM of stomach non-bleeding, pulsatile mass of stomach.  Symptoms: iron defficiency anemia with hemocult +.  . Esophagogastroduodenoscopy  [id number]    NL esophagus.-Bile gastritis.This was biopsied, pulsatile extrinsic mass at the bulb.A few non bleeding angioectasias in the stomach. Sucralfate tab at 1 gram po 2x day indefintely. repeat upper endoscopy prn for surveillance. Schedule an ultrasound of the abdomen to look for aneurysm of gastroduodenal artery.  . Sleep study  05/24/2012    +OSA; CPAP fitted.  Allergies  Allergen Reactions  . Codeine Itching  . Adhesive [Tape] Rash    Any tape - paper, bandaid   Current Outpatient Prescriptions  Medication Sig Dispense Refill  . calcium carbonate (OS-CAL) 600 MG TABS Take 600 mg by mouth 2 (two) times daily with a meal.    . cetirizine (ZYRTEC) 10 MG tablet Take 10 mg by mouth daily.    . fish oil-omega-3 fatty acids 1000 MG capsule Take 2 g  by mouth daily.    . Lutein 6 MG CAPS Take by mouth daily.    Marland Kitchen omeprazole (PRILOSEC) 20 MG capsule TAKE ONE CAPSULE BY MOUTH DAILY 30 capsule 5  . OVER THE COUNTER MEDICATION Redcaps for eyes    . oxybutynin (DITROPAN-XL) 10 MG 24 hr tablet Take 1 tablet (10 mg total) by mouth at bedtime. 90 tablet 3  . sertraline (ZOLOFT) 50 MG tablet Take 1 tablet (50 mg total) by mouth daily. 30 tablet 2  . sucralfate (CARAFATE) 1 G tablet Take 1 g by mouth 2 (two) times daily.    . hydrocortisone 2.5 % ointment Apply topically 2 (two) times daily. 30 g 0  . terconazole (TERAZOL 7) 0.4 % vaginal cream Place 1 applicator vaginally at bedtime. 45 g 0   No current facility-administered medications for this visit.   History   Social History  . Marital Status: Married    Spouse Name: N/A  . Number of Children: 3  . Years of Education: 12   Occupational History  . Homemaker    Social History Main Topics  . Smoking status: Never Smoker   . Smokeless tobacco: Not on file  . Alcohol Use: Yes     Comment: occasional very rarely social events  . Drug Use: No  . Sexual Activity: Yes   Other Topics Concern  . Not on file   Social History Narrative   Smoke alarm and carbon monoxide detector in the home.No guns in the home.Always uses seat belts.    Marital status:  Married x 46 years Happily no abuse.       Caffeine use: Coffee 2 servings per day.       Children: 3 children; 4 grandchildren.       Tobacco: none      Alcohol:  none       Drugs:  none      Exercise: Moderate, Walking 30 minutes daily on treadmill.       Advanced Directives:  Patient does have LIVING WILL patient does have HCPOA. Husband is HCPOA: FULL CODE.       Objective:    BP 132/72 mmHg  Pulse 82  Temp(Src) 98.3 F (36.8 C) (Oral)  Resp 16  Ht 5' 1.75" (1.568 m)  Wt 260 lb 3.2 oz (118.026 kg)  BMI 48.00 kg/m2  SpO2 97% Physical Exam  Constitutional: She appears well-developed and well-nourished. No distress.    Obese   HENT:  Head: Normocephalic and atraumatic.  Right Ear: External ear normal.  Left Ear: External ear normal.  Nose: Nose normal.  Mouth/Throat: Oropharynx is clear and moist.  Eyes: Conjunctivae and EOM are normal. Pupils are equal, round, and reactive to light.  Neck: Normal range of motion. Neck supple.  Cardiovascular: Normal rate, regular rhythm and normal heart sounds.  Exam reveals no gallop and no friction rub.   No murmur heard. Pulmonary/Chest: Effort normal and breath sounds normal. No respiratory distress. She has no wheezes. She has no rales.  Abdominal:  Soft. Bowel sounds are normal. She exhibits no distension and no mass. There is tenderness in the suprapubic area. There is no rebound, no guarding and no CVA tenderness.  Genitourinary: There is no rash, tenderness or lesion on the right labia. There is no rash, tenderness or lesion on the left labia. Right adnexum displays no mass, no tenderness and no fullness. Left adnexum displays no mass, no tenderness and no fullness. There is erythema in the vagina. No tenderness or bleeding in the vagina. No foreign body around the vagina. No signs of injury around the vagina. Vaginal discharge found.  Mild erythema anterior wall of vagina; scant yellow discharge in vaginal vault.    Skin: She is not diaphoretic.   Results for orders placed or performed in visit on 09/24/14  POCT Wet Prep with KOH  Result Value Ref Range   Trichomonas, UA Negative    Clue Cells Wet Prep HPF POC 0-1    Epithelial Wet Prep HPF POC Few Few, Moderate, Many   Yeast Wet Prep HPF POC Trace    Bacteria Wet Prep HPF POC Few None, Few   RBC Wet Prep HPF POC Negative    WBC Wet Prep HPF POC Negative    KOH Prep POC Negative   POCT urinalysis dipstick  Result Value Ref Range   Color, UA Amber    Clarity, UA Clear    Glucose, UA Negative    Bilirubin, UA Negative    Ketones, UA Negative    Spec Grav, UA 1.015    Blood, UA Trace-Intact    pH, UA  5.5    Protein, UA Negative    Urobilinogen, UA 0.2    Nitrite, UA Negative    Leukocytes, UA Trace (A) Negative  POCT UA - Microscopic Only  Result Value Ref Range   WBC, Ur, HPF, POC 0-2    RBC, urine, microscopic 0-1    Bacteria, U Microscopic Negative    Mucus, UA negative    Epithelial cells, urine per micros 0-1    Crystals, Ur, HPF, POC negative    Casts, Ur, LPF, POC Negative    Yeast, UA negative   POCT Skin KOH  Result Value Ref Range   Skin KOH, POC Negative        Assessment & Plan:   1. Vaginal bleeding   2. Vaginal burning   3. Rash and nonspecific skin eruption   4. Anxiety and depression    1. Vaginal bleeding: mild and recurrent vaginal spotting over the past year; s/p evaluation by [facility].  Will treat for Candidiasis due to current itching. If no improvement, will warrant follow-up with gynecology for further evaluation. Suspect atrophic vaginitis is also contributing; pt tried estrogen vaginal therapy for one month; will clarify why she stopped therapy; expense likely contributed. 2.  Vaginal burning: New; recurrent; treat for candidiasis. If persistent, likely secondary to atrophic vaginitis.  Need to explore cheapest alternative. 3.  Rash R upper arm:  New.  Rx for hydrocortisone ointment 2.5% bid. Consistent with local reaction to insect bite. 4. Anxiety and depression with panic attacks: much improved with switch to Zoloft 50mg  daily.   Meds ordered this encounter  Medications  . hydrocortisone 2.5 % ointment    Sig: Apply topically 2 (two) times daily.    Dispense:  30 g    Refill:  0  . terconazole (TERAZOL 7) 0.4 % vaginal cream    Sig: Place 1 applicator vaginally at bedtime.  Dispense:  45 g    Refill:  0    Return in about 3 months (around 12/25/2014) for complete physical examiniation.   Taiesha Bovard Elayne Guerin, M.D. [facility] [address] [place], [place]  [address] [phone] phone [phone] fax

## 2014-10-04 DIAGNOSIS — H3531 Nonexudative age-related macular degeneration: Secondary | ICD-10-CM | POA: Diagnosis not present

## 2014-10-15 ENCOUNTER — Ambulatory Visit (INDEPENDENT_AMBULATORY_CARE_PROVIDER_SITE_OTHER): Payer: Medicare Other

## 2014-10-15 ENCOUNTER — Ambulatory Visit (INDEPENDENT_AMBULATORY_CARE_PROVIDER_SITE_OTHER): Payer: Medicare Other | Admitting: Emergency Medicine

## 2014-10-15 VITALS — BP 142/72 | HR 79 | Temp 97.8°F | Resp 18 | Ht 61.75 in | Wt 260.0 lb

## 2014-10-15 DIAGNOSIS — M25572 Pain in left ankle and joints of left foot: Secondary | ICD-10-CM

## 2014-10-15 NOTE — Patient Instructions (Signed)

## 2014-10-15 NOTE — Progress Notes (Signed)
Patient ID: Faith Thompson, female   DOB: [birth year], 69 y.o.   MRN: [id number]    This chart was scribed for Nena Jordan, MD by [facility], medical scribe at [facility].The patient was seen in exam room 07 and the patient's care was started at 12:48 PM.  Chief Complaint:  Chief Complaint  Patient presents with  . Fall    left ankle, happened yesterday    HPI: Faith Thompson is a 69 y.o. female who reports to [facility] today complaining of a left ankle injury, which occurred yesterday while walking her dog. She slipped and fell to the ground. She ambulates with difficulty. No previous ankle injuries.  Past Medical History  Diagnosis Date  . Anemia   . Asthma   . Depression   . Ulcer   . Overactive bladder   . Allergic rhinitis, cause unspecified   . GERD (gastroesophageal reflux disease)   . Factor V Leiden mutation   . Obesity, unspecified   . Dysthymic disorder   . Macular degeneration (senile) of retina, unspecified   . Gastritis 11/24/2011    EGD: +gastritis; AVM gastric region, pulsatile mass in stomach  . 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  . 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  . Basal cell carcinoma 06/09/12    R facial region; s/p Mohs procedure at [facility].  . OSA on CPAP 05/24/2012    sleep study +OSA; CPAP titration placed.   Past Surgical History  Procedure Laterality Date  . Tonsillectomy  age 37  . Cholecystectomy  1993  . Abdominal hysterectomy  1996    fibroids ovaries removed  . Colonoscopy  10/2011    AVM bleeding in colon s/p cauterization.  Symptoms: anemia, hemoccult +.  . Esophagogastroduodenoscopy  10/2011    gastritis, AVM of stomach non-bleeding, pulsatile mass of stomach.  Symptoms: iron defficiency anemia with hemocult +.  . Esophagogastroduodenoscopy  [id number]    NL esophagus.-Bile  gastritis.This was biopsied, pulsatile extrinsic mass at the bulb.A few non bleeding angioectasias in the stomach. Sucralfate tab at 1 gram po 2x day indefintely. repeat upper endoscopy prn for surveillance. Schedule an ultrasound of the abdomen to look for aneurysm of gastroduodenal artery.  . Sleep study  05/24/2012    +OSA; CPAP fitted.   Social History   Social History  . Marital Status: Married    Spouse Name: N/A  . Number of Children: 3  . Years of Education: 12   Occupational History  . Homemaker    Social History Main Topics  . Smoking status: Never Smoker   . Smokeless tobacco: None  . Alcohol Use: Yes     Comment: occasional very rarely social events  . Drug Use: No  . Sexual Activity: Yes   Other Topics Concern  . None   Social History Narrative   Smoke alarm and carbon monoxide detector in the home.No guns in the home.Always uses seat belts.    Marital status:  Married x 46 years Happily no abuse.       Caffeine use: Coffee 2 servings per day.       Children: 3 children; 4 grandchildren.       Tobacco: none      Alcohol:  none       Drugs:  none      Exercise: Moderate, Walking  30 minutes daily on treadmill.       Advanced Directives:  Patient does have LIVING WILL patient does have HCPOA. Husband is HCPOA: FULL CODE.   Family History  Problem Relation Age of Onset  . Diabetes Mother   . Cancer Father     lung  . Hypertension Sister   . Arthritis Sister   . Heart disease Maternal Uncle   . Heart murmur Sister   . Stroke Paternal Grandmother   . Mental illness Sister     anxiety   Allergies  Allergen Reactions  . Codeine Itching  . Adhesive [Tape] Rash    Any tape - paper, bandaid   Prior to Admission medications   Medication Sig Start Date End Date Taking? Authorizing Provider  calcium carbonate (OS-CAL) 600 MG TABS Take 600 mg by mouth 2 (two) times daily with a meal.   Yes Historical Provider, MD  cetirizine (ZYRTEC) 10 MG tablet Take 10 mg by  mouth daily.   Yes Historical Provider, MD  fish oil-omega-3 fatty acids 1000 MG capsule Take 2 g by mouth daily.   Yes Historical Provider, MD  Lutein 6 MG CAPS Take by mouth daily.   Yes Historical Provider, MD  omeprazole (PRILOSEC) 20 MG capsule TAKE ONE CAPSULE BY MOUTH DAILY 10/24/11  Yes Mancel Bale, PA-C  OVER THE COUNTER MEDICATION Redcaps for eyes   Yes Historical Provider, MD  oxybutynin (DITROPAN-XL) 10 MG 24 hr tablet Take 1 tablet (10 mg total) by mouth at bedtime. 12/13/13  Yes Wardell Honour, MD  sertraline (ZOLOFT) 50 MG tablet Take 1 tablet (50 mg total) by mouth daily. 08/30/14  Yes Wardell Honour, MD  sucralfate (CARAFATE) 1 G tablet Take 1 g by mouth 2 (two) times daily.   Yes Historical Provider, MD  hydrocortisone 2.5 % ointment Apply topically 2 (two) times daily. Patient not taking: Reported on 10/15/2014 09/24/14   Wardell Honour, MD  terconazole (TERAZOL 7) 0.4 % vaginal cream Place 1 applicator vaginally at bedtime. 09/24/14   Wardell Honour, MD   ROS: The patient denies fevers, chills, night sweats, unintentional weight loss, chest pain, palpitations, wheezing, dyspnea on exertion, nausea, vomiting, abdominal pain, dysuria, hematuria, melena, numbness, weakness, or tingling.   All other systems have been reviewed and were otherwise negative with the exception of those mentioned in the HPI and as above.    PHYSICAL EXAM: Filed Vitals:   10/15/14 1206  BP: 142/72  Pulse: 79  Temp: 97.8 F (36.6 C)  Resp: 18   Body mass index is 47.97 kg/(m^2).  General: Alert, no acute distress HEENT:  Normocephalic, atraumatic, oropharynx patent. Eye: Juliette Mangle [facility] Cardiovascular:  Regular rate and rhythm, no rubs murmurs or gallops.  No Carotid bruits, radial pulse intact. No pedal edema.  Respiratory: Clear to auscultation bilaterally.  No wheezes, rales, or rhonchi.  No cyanosis, no use of accessory musculature Abdominal: No organomegaly, abdomen is soft and non-tender,  positive bowel sounds.  No masses. Musculoskeletal: Significant tenderness over the deltoid ligament and distal lateral malleolus. No calf tenderness. Mild tenderness over the medial knee. Skin: No rashes. Neurologic: Facial musculature symmetric. Psychiatric: Patient acts appropriately throughout our interaction. Lymphatic: No cervical or submandibular lymphadenopathy  LABS: Results for orders placed or performed in visit on 09/24/14  POCT Wet Prep with KOH  Result Value Ref Range   Trichomonas, UA Negative    Clue Cells Wet Prep HPF POC 0-1    Epithelial Wet Prep  HPF POC Few Few, Moderate, Many   Yeast Wet Prep HPF POC Trace    Bacteria Wet Prep HPF POC Few None, Few   RBC Wet Prep HPF POC Negative    WBC Wet Prep HPF POC Negative    KOH Prep POC Negative   POCT urinalysis dipstick  Result Value Ref Range   Color, UA Amber    Clarity, UA Clear    Glucose, UA Negative    Bilirubin, UA Negative    Ketones, UA Negative    Spec Grav, UA 1.015    Blood, UA Trace-Intact    pH, UA 5.5    Protein, UA Negative    Urobilinogen, UA 0.2    Nitrite, UA Negative    Leukocytes, UA Trace (A) Negative  POCT UA - Microscopic Only  Result Value Ref Range   WBC, Ur, HPF, POC 0-2    RBC, urine, microscopic 0-1    Bacteria, U Microscopic Negative    Mucus, UA negative    Epithelial cells, urine per micros 0-1    Crystals, Ur, HPF, POC negative    Casts, Ur, LPF, POC Negative    Yeast, UA negative   POCT Skin KOH  Result Value Ref Range   Skin KOH, POC Negative    EKG/XRAY:   Primary read interpreted by Dr. Everlene Farrier at [facility]. No fracture seen   ASSESSMENT/PLAN: Gross sideeffects, risk and benefits, and alternatives of medications d/w patient. Patient is aware that all medications have potential sideeffects and we are unable to predict every sideeffect or drug-drug interaction that may occur. Will treat with ice elevation. She can take some ibuprofen for discomfort. Follow-up in one week  with Dr. Tamala Julian if not significantly better.I personally performed the services described in this documentation, which was scribed in my presence. The recorded information has been reviewed and is accurate.  Nena Jordan, MD     Arlyss Queen MD 10/15/2014 12:48 PM

## 2014-12-11 ENCOUNTER — Other Ambulatory Visit: Payer: Self-pay | Admitting: Family Medicine

## 2014-12-20 ENCOUNTER — Other Ambulatory Visit: Payer: Self-pay | Admitting: Family Medicine

## 2015-01-02 DIAGNOSIS — Z23 Encounter for immunization: Secondary | ICD-10-CM | POA: Diagnosis not present

## 2015-01-05 ENCOUNTER — Other Ambulatory Visit: Payer: Self-pay | Admitting: Family Medicine

## 2015-02-06 ENCOUNTER — Encounter: Payer: Medicare Other | Admitting: Family Medicine

## 2015-02-08 ENCOUNTER — Encounter: Payer: Self-pay | Admitting: Family Medicine

## 2015-02-08 ENCOUNTER — Ambulatory Visit (INDEPENDENT_AMBULATORY_CARE_PROVIDER_SITE_OTHER): Payer: Medicare Other | Admitting: Family Medicine

## 2015-02-08 VITALS — BP 129/77 | HR 88 | Temp 98.5°F | Resp 16 | Ht 62.0 in | Wt 258.0 lb

## 2015-02-08 DIAGNOSIS — E2839 Other primary ovarian failure: Secondary | ICD-10-CM

## 2015-02-08 DIAGNOSIS — R7302 Impaired glucose tolerance (oral): Secondary | ICD-10-CM | POA: Diagnosis not present

## 2015-02-08 DIAGNOSIS — Z23 Encounter for immunization: Secondary | ICD-10-CM | POA: Diagnosis not present

## 2015-02-08 DIAGNOSIS — G2581 Restless legs syndrome: Secondary | ICD-10-CM | POA: Diagnosis not present

## 2015-02-08 DIAGNOSIS — Z1159 Encounter for screening for other viral diseases: Secondary | ICD-10-CM

## 2015-02-08 DIAGNOSIS — Z0000 Encounter for general adult medical examination without abnormal findings: Secondary | ICD-10-CM

## 2015-02-08 DIAGNOSIS — K219 Gastro-esophageal reflux disease without esophagitis: Secondary | ICD-10-CM | POA: Diagnosis not present

## 2015-02-08 DIAGNOSIS — E7800 Pure hypercholesterolemia, unspecified: Secondary | ICD-10-CM | POA: Diagnosis not present

## 2015-02-08 DIAGNOSIS — Q2733 Arteriovenous malformation of digestive system vessel: Secondary | ICD-10-CM | POA: Diagnosis not present

## 2015-02-08 DIAGNOSIS — J301 Allergic rhinitis due to pollen: Secondary | ICD-10-CM | POA: Diagnosis not present

## 2015-02-08 DIAGNOSIS — G4733 Obstructive sleep apnea (adult) (pediatric): Secondary | ICD-10-CM

## 2015-02-08 DIAGNOSIS — K2970 Gastritis, unspecified, without bleeding: Secondary | ICD-10-CM | POA: Diagnosis not present

## 2015-02-08 DIAGNOSIS — H3530 Unspecified macular degeneration: Secondary | ICD-10-CM

## 2015-02-08 DIAGNOSIS — K5521 Angiodysplasia of colon with hemorrhage: Secondary | ICD-10-CM

## 2015-02-08 DIAGNOSIS — E669 Obesity, unspecified: Secondary | ICD-10-CM

## 2015-02-08 DIAGNOSIS — K31819 Angiodysplasia of stomach and duodenum without bleeding: Secondary | ICD-10-CM | POA: Diagnosis not present

## 2015-02-08 DIAGNOSIS — N3941 Urge incontinence: Secondary | ICD-10-CM | POA: Diagnosis not present

## 2015-02-08 DIAGNOSIS — G4700 Insomnia, unspecified: Secondary | ICD-10-CM

## 2015-02-08 DIAGNOSIS — Z1231 Encounter for screening mammogram for malignant neoplasm of breast: Secondary | ICD-10-CM | POA: Diagnosis not present

## 2015-02-08 DIAGNOSIS — Z9989 Dependence on other enabling machines and devices: Secondary | ICD-10-CM

## 2015-02-08 LAB — POCT URINALYSIS DIP (MANUAL ENTRY)
Bilirubin, UA: NEGATIVE
GLUCOSE UA: NEGATIVE
Ketones, POC UA: NEGATIVE
NITRITE UA: NEGATIVE
Protein Ur, POC: NEGATIVE
Spec Grav, UA: 1.02
Urobilinogen, UA: 0.2
pH, UA: 6.5

## 2015-02-08 LAB — CBC WITH DIFFERENTIAL/PLATELET
BASOS ABS: 0.1 10*3/uL (ref 0.0–0.1)
Basophils Relative: 1 % (ref 0–1)
EOS ABS: 0.1 10*3/uL (ref 0.0–0.7)
Eosinophils Relative: 1 % (ref 0–5)
HEMATOCRIT: 35.8 % — AB (ref 36.0–46.0)
HEMOGLOBIN: 12 g/dL (ref 12.0–15.0)
LYMPHS ABS: 2.5 10*3/uL (ref 0.7–4.0)
LYMPHS PCT: 33 % (ref 12–46)
MCH: 26 pg (ref 26.0–34.0)
MCHC: 33.5 g/dL (ref 30.0–36.0)
MCV: 77.5 fL — AB (ref 78.0–100.0)
MPV: 10 fL (ref 8.6–12.4)
Monocytes Absolute: 0.5 10*3/uL (ref 0.1–1.0)
Monocytes Relative: 7 % (ref 3–12)
NEUTROS ABS: 4.5 10*3/uL (ref 1.7–7.7)
NEUTROS PCT: 58 % (ref 43–77)
Platelets: 338 10*3/uL (ref 150–400)
RBC: 4.62 MIL/uL (ref 3.87–5.11)
RDW: 16.6 % — ABNORMAL HIGH (ref 11.5–15.5)
WBC: 7.7 10*3/uL (ref 4.0–10.5)

## 2015-02-08 LAB — COMPREHENSIVE METABOLIC PANEL
ALBUMIN: 4.1 g/dL (ref 3.6–5.1)
ALK PHOS: 104 U/L (ref 33–130)
ALT: 14 U/L (ref 6–29)
AST: 19 U/L (ref 10–35)
BUN: 11 mg/dL (ref 7–25)
CO2: 27 mmol/L (ref 20–31)
Calcium: 9 mg/dL (ref 8.6–10.4)
Chloride: 106 mmol/L (ref 98–110)
Creat: 0.62 mg/dL (ref 0.50–0.99)
Glucose, Bld: 86 mg/dL (ref 65–99)
POTASSIUM: 3.8 mmol/L (ref 3.5–5.3)
Sodium: 143 mmol/L (ref 135–146)
TOTAL PROTEIN: 7.1 g/dL (ref 6.1–8.1)
Total Bilirubin: 0.5 mg/dL (ref 0.2–1.2)

## 2015-02-08 LAB — LIPID PANEL
Cholesterol: 149 mg/dL (ref 125–200)
HDL: 58 mg/dL (ref >=46)
LDL CALC: 78 mg/dL (ref <130)
TRIGLYCERIDES: 67 mg/dL (ref <150)
Total CHOL/HDL Ratio: 2.6 Ratio (ref <=5.0)
VLDL: 13 mg/dL (ref <30)

## 2015-02-08 MED ORDER — SERTRALINE HCL 50 MG PO TABS: ORAL_TABLET | ORAL | Status: DC

## 2015-02-08 MED ORDER — OXYBUTYNIN CHLORIDE ER 10 MG PO TB24: 10.0000 mg | ORAL_TABLET | Freq: Every day | ORAL | Status: DC

## 2015-02-08 MED ORDER — OMEPRAZOLE 20 MG PO CPDR: 20.0000 mg | DELAYED_RELEASE_CAPSULE | Freq: Every day | ORAL | Status: DC

## 2015-02-08 NOTE — Patient Instructions (Signed)

## 2015-02-08 NOTE — Progress Notes (Signed)
Subjective:    Patient ID: Faith Thompson, female    DOB: [birth year], 69 y.o.   MRN: [id number]  02/08/2015  Annual Exam and Medication Refill   HPI This 69 y.o. female presents for Annual Wellness Examination.  Last physical: 12-13-2013 Pap smear:  Hysterectomy; ovaries removed. Mammogram:  02-05-14  Colonoscopy: 02-24-2008 Bone density:  Agreeable.   TDAP:  2008 Pneumovax:  2010, 2015 Zostavax:  2008 Influenza:  2016 Eye exam:  +glaseses Dental exam:   Macular degeneration: now no longer to drive at night; does not drive long distances.    Review of Systems  Constitutional: Negative for fever, chills, diaphoresis, activity change, appetite change, fatigue and unexpected weight change.  HENT: Negative for congestion, dental problem, drooling, ear discharge, ear pain, facial swelling, hearing loss, mouth sores, nosebleeds, postnasal drip, rhinorrhea, sinus pressure, sneezing, sore throat, tinnitus, trouble swallowing and voice change.   Eyes: Negative for photophobia, pain, discharge, redness, itching and visual disturbance.  Respiratory: Negative for apnea, cough, choking, chest tightness, shortness of breath, wheezing and stridor.   Cardiovascular: Negative for chest pain, palpitations and leg swelling.  Gastrointestinal: Negative for nausea, vomiting, abdominal pain, diarrhea, constipation, blood in stool, abdominal distention, anal bleeding and rectal pain.  Endocrine: Negative for cold intolerance, heat intolerance, polydipsia, polyphagia and polyuria.  Genitourinary: Negative for dysuria, urgency, frequency, hematuria, flank pain, decreased urine volume, vaginal bleeding, vaginal discharge, enuresis, difficulty urinating, genital sores, vaginal pain, menstrual problem, pelvic pain and dyspareunia.  Musculoskeletal: Negative for myalgias, back pain, joint swelling, arthralgias, gait problem, neck pain and neck stiffness.  Skin: Negative for color change, pallor, rash and  wound.  Allergic/Immunologic: Negative for environmental allergies, food allergies and immunocompromised state.  Neurological: Negative for dizziness, tremors, seizures, syncope, facial asymmetry, speech difficulty, weakness, light-headedness, numbness and headaches.  Hematological: Negative for adenopathy. Does not bruise/bleed easily.  Psychiatric/Behavioral: Negative for suicidal ideas, hallucinations, behavioral problems, confusion, sleep disturbance, self-injury, dysphoric mood, decreased concentration and agitation. The patient is not nervous/anxious and is not hyperactive.     Past Medical History  Diagnosis Date  . Anemia   . Asthma   . Depression   . Ulcer   . Overactive bladder   . Allergic rhinitis, cause unspecified   . GERD (gastroesophageal reflux disease)   . Factor V Leiden mutation ([place])   . Obesity, unspecified   . Dysthymic disorder   . Macular degeneration (senile) of retina, unspecified   . Gastritis 11/24/2011    EGD: +gastritis; AVM gastric region, pulsatile mass in stomach  . 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  . 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  . Basal cell carcinoma 06/09/12    R facial region; s/p Mohs procedure at [facility].  . OSA on CPAP 05/24/2012    sleep study +OSA; CPAP titration placed.   Past Surgical History  Procedure Laterality Date  . Tonsillectomy  age 46  . Cholecystectomy  1993  . Colonoscopy  10/2011    AVM bleeding in colon s/p cauterization.  Symptoms: anemia, hemoccult +.  . Esophagogastroduodenoscopy  10/2011    gastritis, AVM of stomach non-bleeding, pulsatile mass of stomach.  Symptoms: iron defficiency anemia with hemocult +.  . Esophagogastroduodenoscopy  [id number]    NL esophagus.-Bile gastritis.This was biopsied, pulsatile extrinsic mass at the bulb.A few non bleeding angioectasias  in the stomach. Sucralfate tab at  1 gram po 2x day indefintely. repeat upper endoscopy prn for surveillance. Schedule an ultrasound of the abdomen to look for aneurysm of gastroduodenal artery.  . Sleep study  05/24/2012    +OSA; CPAP fitted.  . Abdominal hysterectomy  1996    fibroids; ovaries removed.     Allergies  Allergen Reactions  . Codeine Itching  . Adhesive [Tape] Rash    Any tape - paper, bandaid   Current Outpatient Prescriptions  Medication Sig Dispense Refill  . calcium carbonate (OS-CAL) 600 MG TABS Take 600 mg by mouth 2 (two) times daily with a meal.    . cetirizine (ZYRTEC) 10 MG tablet Take 10 mg by mouth daily.    . fish oil-omega-3 fatty acids 1000 MG capsule Take 2 g by mouth daily.    . Lutein 6 MG CAPS Take by mouth daily.    Marland Kitchen omeprazole (PRILOSEC) 20 MG capsule Take 1 capsule (20 mg total) by mouth daily. 90 capsule 3  . OVER THE COUNTER MEDICATION Redcaps for eyes    . oxybutynin (DITROPAN-XL) 10 MG 24 hr tablet Take 1 tablet (10 mg total) by mouth at bedtime. 90 tablet 3  . sertraline (ZOLOFT) 50 MG tablet TAKE ONE TABLET BY MOUTH EVERY DAY 90 tablet 3  . sucralfate (CARAFATE) 1 G tablet Take 1 g by mouth 2 (two) times daily.    . hydrocortisone 2.5 % ointment Apply topically 2 (two) times daily. (Patient not taking: Reported on 10/15/2014) 30 g 0   No current facility-administered medications for this visit.   Social History   Social History  . Marital Status: Married    Spouse Name: Cori Razor Sr.  . Number of Children: 3  . Years of Education: 12   Occupational History  . Homemaker    Social History Main Topics  . Smoking status: Never Smoker   . Smokeless tobacco: Not on file  . Alcohol Use: 0.0 oz/week    0 Standard drinks or equivalent per week     Comment: occasional very rarely social events  . Drug Use: No  . Sexual Activity:    Partners: Male   Other Topics Concern  . Not on file   Social History Narrative   Smoke alarm and carbon  monoxide detector in the home.No guns in the home.Always uses seat belts.    Marital status:  Married x 67 years Happily no abuse.       Caffeine use: Coffee 2 servings per day.       Children: 3 children; 4 grandchildren.       Tobacco: none      Alcohol:  none       Drugs:  none      Exercise: none      Advanced Directives:  Patient does have LIVING WILL patient does have HCPOA. Husband is HCPOA: FULL CODE.      ADLs: independent with ADLs.  No assistant devices.   Family History  Problem Relation Age of Onset  . Diabetes Mother   . Cancer Father     lung  . Hypertension Sister   . Arthritis Sister   . Heart disease Maternal Uncle   . Heart murmur Sister   . Stroke Paternal Grandmother   . Mental illness Sister     anxiety       Objective:    BP 129/77 mmHg  Pulse 88  Temp(Src) 98.5 F (36.9 C) (Oral)  Resp 16  Ht 5\' 2"  (1.575 m)  Wt 258 lb (117.028 kg)  BMI 47.18 kg/m2  SpO2 97% Physical Exam  Constitutional: She is oriented to person, place, and time. She appears well-developed and well-nourished. No distress.  HENT:  Head: Normocephalic and atraumatic.  Right Ear: External ear normal.  Left Ear: External ear normal.  Nose: Nose normal.  Mouth/Throat: Oropharynx is clear and moist.  Eyes: Conjunctivae and EOM are normal. Pupils are equal, round, and reactive to light.  Neck: Normal range of motion and full passive range of motion without pain. Neck supple. No JVD present. Carotid bruit is not present. No thyromegaly present.  Cardiovascular: Normal rate, regular rhythm and normal heart sounds.  Exam reveals no gallop and no friction rub.   No murmur heard. Pulmonary/Chest: Effort normal and breath sounds normal. She has no wheezes. She has no rales.  Abdominal: Soft. Bowel sounds are normal. She exhibits no distension and no mass. There is no tenderness. There is no rebound and no guarding.  Musculoskeletal:       Right shoulder: Normal.       Left shoulder:  Normal.       Cervical back: Normal.  Lymphadenopathy:    She has no cervical adenopathy.  Neurological: She is alert and oriented to person, place, and time. She has normal reflexes. No cranial nerve deficit. She exhibits normal muscle tone. Coordination normal.  Skin: Skin is warm and dry. No rash noted. She is not diaphoretic. No erythema. No pallor.  Psychiatric: She has a normal mood and affect. Her behavior is normal. Judgment and thought content normal.  Nursing note and vitals reviewed.       Assessment & Plan:   1. Encounter for Medicare annual wellness exam   2. Gastroesophageal reflux disease without esophagitis   3. Allergic rhinitis due to pollen   4. Macular degeneration (senile) of retina   5. Insomnia   6. Urge incontinence of urine   7. Gastritis   8. AVM (arteriovenous malformation) of stomach, acquired   9. AVM (arteriovenous malformation) of colon with hemorrhage   10. OSA on CPAP   11. Restless leg syndrome   12. Obesity   13. Glucose intolerance (impaired glucose tolerance)   14. Pure hypercholesterolemia   15. Need for hepatitis C screening test   16. Estrogen deficiency   17. Encounter for screening mammogram for breast cancer     Orders Placed This Encounter  Procedures  . DG Bone Density    Standing Status: Future     Number of Occurrences:      Standing Expiration Date: 04/10/2016    Order Specific Question:  Reason for Exam (SYMPTOM  OR DIAGNOSIS REQUIRED)    Answer:  estrogen deficiency    Order Specific Question:  Preferred imaging location?    Answer:  [facility]  . MM Digital Screening    Standing Status: Future     Number of Occurrences:      Standing Expiration Date: 04/10/2016    Order Specific Question:  Reason for Exam (SYMPTOM  OR DIAGNOSIS REQUIRED)    Answer:  annual screening    Order Specific Question:  Preferred [facility] location?    [facility]  . Pneumococcal polysaccharide vaccine 23-valent greater  than or equal to 2yo subcutaneous/IM  . CBC with Differential/Platelet  . Comprehensive metabolic panel    Order Specific Question:  Has the patient fasted?    Answer:  Yes  . Hemoglobin A1c  . Hepatitis C antibody  . Lipid  panel    Order Specific Question:  Has the patient fasted?    Answer:  Yes  . POCT urinalysis dipstick   Meds ordered this encounter  Medications  . sertraline (ZOLOFT) 50 MG tablet    Sig: TAKE ONE TABLET BY MOUTH EVERY DAY    Dispense:  90 tablet    Refill:  3  . oxybutynin (DITROPAN-XL) 10 MG 24 hr tablet    Sig: Take 1 tablet (10 mg total) by mouth at bedtime.    Dispense:  90 tablet    Refill:  3  . omeprazole (PRILOSEC) 20 MG capsule    Sig: Take 1 capsule (20 mg total) by mouth daily.    Dispense:  90 capsule    Refill:  3    Return for recheck anxiety/depression.    Sheba Whaling Elayne Guerin, M.D. [facility] [address] [place], [place]  [id number] [phone] phone [phone] fax

## 2015-02-09 LAB — HEMOGLOBIN A1C
HEMOGLOBIN A1C: 5.6 % (ref <5.7)
Mean Plasma Glucose: 114 mg/dL (ref <117)

## 2015-02-09 LAB — HEPATITIS C ANTIBODY: HCV Ab: NEGATIVE

## 2015-03-11 ENCOUNTER — Encounter: Payer: Self-pay | Admitting: Family Medicine

## 2015-04-16 ENCOUNTER — Telehealth: Payer: Self-pay | Admitting: Emergency Medicine

## 2015-04-16 NOTE — Telephone Encounter (Signed)
Can you schedule her please, for [name]? I just so happened to walk up front when she walked in and I helped her at the front desk, that is how I got invovled. Thank you!

## 2015-04-16 NOTE — Telephone Encounter (Signed)
Pt scheduled for 04/17/2015@3 :00/MW

## 2015-04-16 NOTE — Telephone Encounter (Signed)
I am ok with her re-establishing.

## 2015-04-16 NOTE — Telephone Encounter (Signed)
Pt is a prior Dr. Tamala Julian pt. (she used to work here). She has been seeing Dr. Tamala Julian in [place] and it has became too much to to travel there and back all the time. She would like to get re-established here. Pt would like to know if you would take her as a patient.   CB [phone]

## 2015-04-17 ENCOUNTER — Encounter: Payer: Self-pay | Admitting: Physician Assistant

## 2015-04-17 ENCOUNTER — Ambulatory Visit (INDEPENDENT_AMBULATORY_CARE_PROVIDER_SITE_OTHER): Payer: Medicare Other | Admitting: Physician Assistant

## 2015-04-17 VITALS — BP 140/60 | HR 69 | Temp 98.5°F | Resp 16 | Wt 262.0 lb

## 2015-04-17 DIAGNOSIS — Z7189 Other specified counseling: Secondary | ICD-10-CM

## 2015-04-17 DIAGNOSIS — M1711 Unilateral primary osteoarthritis, right knee: Secondary | ICD-10-CM | POA: Diagnosis not present

## 2015-04-17 DIAGNOSIS — Z7689 Persons encountering health services in other specified circumstances: Secondary | ICD-10-CM

## 2015-04-17 DIAGNOSIS — N3946 Mixed incontinence: Secondary | ICD-10-CM

## 2015-04-17 MED ORDER — MELOXICAM 7.5 MG PO TABS: 7.5000 mg | ORAL_TABLET | Freq: Every day | ORAL | Status: DC

## 2015-04-17 NOTE — Progress Notes (Signed)
Patient: Faith Thompson, Female    DOB: [birth year], 70 y.o.   MRN: [id number] Visit Date: 04/17/2015  Today's Provider: Mar Daring, PA-C   Chief Complaint  Patient presents with  . Establish Care    Re-Establihing care   Subjective:    Establish Care:  Faith Thompson is a 70 y.o. female who presents today for re-establishing care. She had her wellness done 01/2015 by Dr. Reginia Forts.  She is re-establishing here because it has become to difficult for her to drive to [facility] secondary to her macular degeneration. She is complaining of Right Knee pain.   Knee Pain: Patient presents with knee pain involving the  right knee. Onset of the symptoms was several days ago. Inciting event: none known. Current symptoms include pain located on her lower extremity.. Pain is aggravated by walking.  Patient has had prior knee problems. Evaluation to date: Ortho consult: 2 years ago.. Treatment to date: none. Patient was advised two years ago by her Orthopedic doctor at [facility] that she needed a TKR at that time but that he would not do it until she was able to lose some weight.  Previous Xrays per patient showed almost no joint space.   Review of Systems  Constitutional: Negative.   HENT: Negative.   Eyes: Negative.   Respiratory: Negative.   Cardiovascular: Negative.   Gastrointestinal: Negative.   Endocrine: Negative.   Genitourinary: Positive for enuresis.  Musculoskeletal: Positive for arthralgias (right knee). Negative for back pain, joint swelling and gait problem.  Skin: Negative.   Allergic/Immunologic: Negative.   Neurological: Negative.   Hematological: Negative.   Psychiatric/Behavioral: Negative.     Social History      She  reports that she has never smoked. She does not have any smokeless tobacco history on file. She reports that she drinks alcohol. She reports that she does not use illicit drugs.       Social History   Social History  . Marital Status:  Married    Spouse Name: Cori Razor Sr.  . Number of Children: 3  . Years of Education: 12   Occupational History  . Homemaker    Social History Main Topics  . Smoking status: Never Smoker   . Smokeless tobacco: None  . Alcohol Use: 0.0 oz/week    0 Standard drinks or equivalent per week     Comment: occasional very rarely social events  . Drug Use: No  . Sexual Activity:    Partners: Male   Other Topics Concern  . None   Social History Narrative   Smoke alarm and carbon monoxide detector in the home.No guns in the home.Always uses seat belts.    Marital status:  Married x 39 years Happily no abuse.       Caffeine use: Coffee 2 servings per day.       Children: 3 children; 4 grandchildren.       Tobacco: none      Alcohol:  none       Drugs:  none      Exercise: none      Advanced Directives:  Patient does have LIVING WILL patient does have HCPOA. Husband is HCPOA: FULL CODE.      ADLs: independent with ADLs.  No assistant devices.    Past Medical History  Diagnosis Date  . Anemia   . Asthma   . Depression   . Ulcer   . Overactive bladder   .  Allergic rhinitis, cause unspecified   . GERD (gastroesophageal reflux disease)   . Factor V Leiden mutation ([place])   . Obesity, unspecified   . Dysthymic disorder   . Macular degeneration (senile) of retina, unspecified   . Gastritis 11/24/2011    EGD: +gastritis; AVM gastric region, pulsatile mass in stomach  . 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  . 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  . Basal cell carcinoma 06/09/12    R facial region; s/p Mohs procedure at [facility].  . OSA on CPAP 05/24/2012    sleep study +OSA; CPAP titration placed.     Patient Active Problem List   Diagnosis Date Noted  . Restless leg syndrome 10/11/2012  . OSA on CPAP 09/28/2012  . Routine general medical  examination at a health care facility 09/28/2012  . Urinary incontinence 02/29/2012  . Snoring 02/29/2012  . Hypersomnolence 02/29/2012  . Overactive bladder 02/09/2012  . Insomnia 02/09/2012  . AVM (arteriovenous malformation) of stomach, acquired 02/09/2012  . Gastritis 02/09/2012  . Depression 02/09/2012  . Iron deficiency anemia 02/09/2012  . AVM (arteriovenous malformation) of colon with hemorrhage 02/09/2012  . ANXIETY, SITUATIONAL 10/13/2006  . FACTOR V DEFICIENCY 10/06/2006  . Macular degeneration (senile) of retina 10/06/2006  . SYMPTOM, DISTURBANCE, SLEEP NOS 10/06/2006  . Obesity 06/10/2006  . Allergic rhinitis 06/10/2006  . GERD 06/10/2006  . OSTEOARTHRITIS 06/10/2006    Past Surgical History  Procedure Laterality Date  . Tonsillectomy  age 54  . Cholecystectomy  1993  . Colonoscopy  10/2011    AVM bleeding in colon s/p cauterization.  Symptoms: anemia, hemoccult +.  . Esophagogastroduodenoscopy  10/2011    gastritis, AVM of stomach non-bleeding, pulsatile mass of stomach.  Symptoms: iron defficiency anemia with hemocult +.  . Esophagogastroduodenoscopy  [id number]    NL esophagus.-Bile gastritis.This was biopsied, pulsatile extrinsic mass at the bulb.A few non bleeding angioectasias in the stomach. Sucralfate tab at 1 gram po 2x day indefintely. repeat upper endoscopy prn for surveillance. Schedule an ultrasound of the abdomen to look for aneurysm of gastroduodenal artery.  . Sleep study  05/24/2012    +OSA; CPAP fitted.  . Abdominal hysterectomy  1996    fibroids; ovaries removed.      Family History        Family Status  Relation Status Death Age  . Mother Deceased 5    urosepsis  . Father Deceased 62    unknown  . Sister Alive   . Maternal Uncle Deceased     MI  . Sister Alive   . Sister Alive   . Sister Alive         Her family history includes Arthritis in her sister; Cancer in her father; Diabetes in her mother; Heart disease in her maternal uncle;  Heart murmur in her sister; Hypertension in her sister; Mental illness in her sister; Stroke in her paternal grandmother.    Allergies  Allergen Reactions  . Codeine Itching  . Adhesive [Tape] Rash    Any tape - paper, bandaid    Previous Medications   CALCIUM CARBONATE (OS-CAL) 600 MG TABS    Take 600 mg by mouth 2 (two) times daily with a meal.   CETIRIZINE (ZYRTEC) 10 MG TABLET    Take 10 mg by mouth daily.   FISH OIL-OMEGA-3 FATTY ACIDS 1000 MG CAPSULE  Take 2 g by mouth daily.   HYDROCORTISONE 2.5 % OINTMENT    Apply topically 2 (two) times daily.   LUTEIN 6 MG CAPS    Take by mouth daily.   OMEPRAZOLE (PRILOSEC) 20 MG CAPSULE    Take 1 capsule (20 mg total) by mouth daily.   OVER THE COUNTER MEDICATION    Redcaps for eyes   OXYBUTYNIN (DITROPAN-XL) 10 MG 24 HR TABLET    Take 1 tablet (10 mg total) by mouth at bedtime.   SERTRALINE (ZOLOFT) 50 MG TABLET    TAKE ONE TABLET BY MOUTH EVERY DAY   SUCRALFATE (CARAFATE) 1 G TABLET    Take 1 g by mouth 2 (two) times daily.    Patient Care Team: Mar Daring, PA-C as PCP - General (Family Medicine)     Objective:   Vitals: BP 140/60 mmHg  Pulse 69  Temp(Src) 98.5 F (36.9 C) (Oral)  Resp 16  Wt 262 lb (118.842 kg)   Physical Exam  Constitutional: She is oriented to person, place, and time. She appears well-developed and well-nourished. No distress.  HENT:  Head: Normocephalic and atraumatic.  Right Ear: External ear normal.  Left Ear: External ear normal.  Nose: Nose normal.  Mouth/Throat: Oropharynx is clear and moist. No oropharyngeal exudate.  Eyes: Conjunctivae and EOM are normal. Pupils are equal, round, and reactive to light. Right eye exhibits no discharge. Left eye exhibits no discharge. No scleral icterus.  Neck: Normal range of motion. Neck supple. No JVD present. No tracheal deviation present. No thyromegaly present.  Cardiovascular: Normal rate, regular rhythm, normal heart sounds and intact distal  pulses.  Exam reveals no gallop and no friction rub.   No murmur heard. Pulmonary/Chest: Effort normal and breath sounds normal. No respiratory distress. She has no wheezes. She has no rales. She exhibits no tenderness.  Abdominal: Soft. Bowel sounds are normal. She exhibits no distension and no mass. There is no tenderness. There is no rebound and no guarding.  Musculoskeletal: Normal range of motion. She exhibits no edema or tenderness.       Right knee: Normal.       Left knee: Normal.  Right knee normal on exam but painful to bear weight.  She does not report any feelings of instability.  Lymphadenopathy:    She has no cervical adenopathy.  Neurological: She is alert and oriented to person, place, and time.  Skin: Skin is warm and dry. No rash noted. She is not diaphoretic.  Psychiatric: She has a normal mood and affect. Her behavior is normal. Judgment and thought content normal.  Vitals reviewed.    Depression Screen PHQ 2/9 Scores 02/08/2015 10/15/2014 09/24/2014 12/13/2013  PHQ - 2 Score 0 0 0 0      Assessment & Plan:    1. Mixed incontinence Currently on oxybutynin 10 mg. States she has been maintained on this medication for many years. We discussed changing medications vs further treatment options.  She states she now has to wear a pad for incontinence and would like further evaluation and treatment by a Urologist for these symptoms. She is requesting a female urologist with [facility]. We will facilitate this referral. - Ambulatory referral to Urology  2. Primary osteoarthritis of right knee Worsening symptoms of arthritis of the right knee. Offered imaging and cortisone injection but patient prefers to see the orthopedic surgeon she had seen previously. We discussed cortisone injections vs hyaluronic acid injections vs surgery. She states she would be  interested in the hyaluronic acid injections if she is a candidate and will start that with the orthopedic she had been established  with. Will give low dose meloxicam for interim until she can be seen by ortho.  Will not give long term secondary to AVM in stomach and intestine. She is to call the office if symptoms worsen before she can be seen.  - meloxicam (MOBIC) 7.5 MG tablet; Take 1 tablet (7.5 mg total) by mouth daily.  Dispense: 30 tablet; Refill: 0  3. Establishing care with new doctor, encounter for Previous patient of Dr. Reginia Forts reestablishing with the office due to difficulty driving to [facility] secondary to her macular degeneration.  I will see her back in 6 months for labs of chronic issues unless she develops any acute issue, question or concern.

## 2015-04-17 NOTE — Patient Instructions (Signed)

## 2015-04-25 DIAGNOSIS — H353132 Nonexudative age-related macular degeneration, bilateral, intermediate dry stage: Secondary | ICD-10-CM | POA: Diagnosis not present

## 2015-05-06 ENCOUNTER — Other Ambulatory Visit: Payer: Self-pay | Admitting: Family Medicine

## 2015-05-06 ENCOUNTER — Ambulatory Visit
Admission: RE | Admit: 2015-05-06 | Discharge: 2015-05-06 | Disposition: A | Discharge status: Home / Self Care | Payer: Medicare Other | Source: Ambulatory Visit | Attending: Family Medicine | Admitting: Family Medicine

## 2015-05-06 DIAGNOSIS — Z1231 Encounter for screening mammogram for malignant neoplasm of breast: Secondary | ICD-10-CM | POA: Diagnosis not present

## 2015-05-14 ENCOUNTER — Ambulatory Visit
Admission: RE | Admit: 2015-05-14 | Discharge: 2015-05-14 | Disposition: A | Discharge status: Home / Self Care | Payer: Medicare Other | Source: Ambulatory Visit | Attending: Family Medicine | Admitting: Family Medicine

## 2015-05-14 ENCOUNTER — Telehealth: Payer: Self-pay

## 2015-05-14 DIAGNOSIS — E2839 Other primary ovarian failure: Secondary | ICD-10-CM

## 2015-05-14 DIAGNOSIS — M8588 Other specified disorders of bone density and structure, other site: Secondary | ICD-10-CM | POA: Insufficient documentation

## 2015-05-14 DIAGNOSIS — M85851 Other specified disorders of bone density and structure, right thigh: Secondary | ICD-10-CM | POA: Diagnosis not present

## 2015-05-14 NOTE — Telephone Encounter (Signed)
Unable to leave a message. Patient's phone lost connection.

## 2015-05-14 NOTE — Telephone Encounter (Signed)
-----   Message from Mar Daring, [place] sent at 05/14/2015 11:16 AM EDT ----- Bone density shows osteopenia, but very close to osteoporosis. Continue calcium supplement and add vit d supplement (at least 800 IU) daily. Will recheck bone density in one-two years and adjust therapy if needed.

## 2015-05-15 NOTE — Telephone Encounter (Signed)
Left message to call back  

## 2015-05-16 NOTE — Telephone Encounter (Signed)
Patient advised as directed below.  Thanks,  -Jorel Gravlin 

## 2015-05-29 DIAGNOSIS — H3530 Unspecified macular degeneration: Secondary | ICD-10-CM | POA: Diagnosis not present

## 2015-05-29 DIAGNOSIS — R32 Unspecified urinary incontinence: Secondary | ICD-10-CM | POA: Diagnosis not present

## 2015-05-29 DIAGNOSIS — Z79899 Other long term (current) drug therapy: Secondary | ICD-10-CM | POA: Diagnosis not present

## 2015-05-29 DIAGNOSIS — R310 Gross hematuria: Secondary | ICD-10-CM | POA: Diagnosis not present

## 2015-05-29 DIAGNOSIS — D649 Anemia, unspecified: Secondary | ICD-10-CM | POA: Diagnosis not present

## 2015-05-29 DIAGNOSIS — N3946 Mixed incontinence: Secondary | ICD-10-CM | POA: Diagnosis not present

## 2015-05-29 DIAGNOSIS — F329 Major depressive disorder, single episode, unspecified: Secondary | ICD-10-CM | POA: Diagnosis not present

## 2015-05-29 DIAGNOSIS — R682 Dry mouth, unspecified: Secondary | ICD-10-CM | POA: Diagnosis not present

## 2015-05-29 DIAGNOSIS — J45909 Unspecified asthma, uncomplicated: Secondary | ICD-10-CM | POA: Diagnosis not present

## 2015-07-16 DIAGNOSIS — K760 Fatty (change of) liver, not elsewhere classified: Secondary | ICD-10-CM | POA: Diagnosis not present

## 2015-07-16 DIAGNOSIS — R310 Gross hematuria: Secondary | ICD-10-CM | POA: Diagnosis not present

## 2015-07-16 DIAGNOSIS — R32 Unspecified urinary incontinence: Secondary | ICD-10-CM | POA: Diagnosis not present

## 2015-07-16 DIAGNOSIS — K5730 Diverticulosis of large intestine without perforation or abscess without bleeding: Secondary | ICD-10-CM | POA: Diagnosis not present

## 2015-08-21 DIAGNOSIS — Z79899 Other long term (current) drug therapy: Secondary | ICD-10-CM | POA: Diagnosis not present

## 2015-08-21 DIAGNOSIS — F329 Major depressive disorder, single episode, unspecified: Secondary | ICD-10-CM | POA: Diagnosis not present

## 2015-08-21 DIAGNOSIS — M1990 Unspecified osteoarthritis, unspecified site: Secondary | ICD-10-CM | POA: Diagnosis not present

## 2015-08-21 DIAGNOSIS — R300 Dysuria: Secondary | ICD-10-CM | POA: Diagnosis not present

## 2015-08-21 DIAGNOSIS — Z91040 Latex allergy status: Secondary | ICD-10-CM | POA: Diagnosis not present

## 2015-08-21 DIAGNOSIS — R32 Unspecified urinary incontinence: Secondary | ICD-10-CM | POA: Diagnosis not present

## 2015-08-21 DIAGNOSIS — R350 Frequency of micturition: Secondary | ICD-10-CM | POA: Diagnosis not present

## 2015-08-21 DIAGNOSIS — K219 Gastro-esophageal reflux disease without esophagitis: Secondary | ICD-10-CM | POA: Diagnosis not present

## 2015-08-21 DIAGNOSIS — N3946 Mixed incontinence: Secondary | ICD-10-CM | POA: Diagnosis not present

## 2015-08-21 DIAGNOSIS — J45909 Unspecified asthma, uncomplicated: Secondary | ICD-10-CM | POA: Diagnosis not present

## 2015-08-21 DIAGNOSIS — Z885 Allergy status to narcotic agent status: Secondary | ICD-10-CM | POA: Diagnosis not present

## 2015-08-21 DIAGNOSIS — D6851 Activated protein C resistance: Secondary | ICD-10-CM | POA: Diagnosis not present

## 2015-08-23 DIAGNOSIS — L298 Other pruritus: Secondary | ICD-10-CM | POA: Diagnosis not present

## 2015-08-23 DIAGNOSIS — Z08 Encounter for follow-up examination after completed treatment for malignant neoplasm: Secondary | ICD-10-CM | POA: Diagnosis not present

## 2015-08-23 DIAGNOSIS — Z85828 Personal history of other malignant neoplasm of skin: Secondary | ICD-10-CM | POA: Diagnosis not present

## 2015-08-23 DIAGNOSIS — D2339 Other benign neoplasm of skin of other parts of face: Secondary | ICD-10-CM | POA: Diagnosis not present

## 2015-08-26 ENCOUNTER — Ambulatory Visit (INDEPENDENT_AMBULATORY_CARE_PROVIDER_SITE_OTHER): Payer: Medicare Other | Admitting: Physician Assistant

## 2015-08-26 ENCOUNTER — Ambulatory Visit
Admission: RE | Admit: 2015-08-26 | Discharge: 2015-08-26 | Disposition: A | Discharge status: Home / Self Care | Payer: Medicare Other | Source: Ambulatory Visit | Attending: Physician Assistant | Admitting: Physician Assistant

## 2015-08-26 ENCOUNTER — Telehealth: Payer: Self-pay

## 2015-08-26 ENCOUNTER — Encounter: Payer: Self-pay | Admitting: Physician Assistant

## 2015-08-26 VITALS — BP 130/80 | HR 77 | Temp 98.4°F | Resp 16 | Wt 265.0 lb

## 2015-08-26 DIAGNOSIS — R1011 Right upper quadrant pain: Secondary | ICD-10-CM

## 2015-08-26 DIAGNOSIS — M62838 Other muscle spasm: Secondary | ICD-10-CM

## 2015-08-26 DIAGNOSIS — M940 Chondrocostal junction syndrome [Tietze]: Secondary | ICD-10-CM | POA: Diagnosis not present

## 2015-08-26 DIAGNOSIS — R0602 Shortness of breath: Secondary | ICD-10-CM | POA: Diagnosis not present

## 2015-08-26 MED ORDER — MELOXICAM 7.5 MG PO TABS: 7.5000 mg | ORAL_TABLET | Freq: Every day | ORAL | Status: DC

## 2015-08-26 MED ORDER — CYCLOBENZAPRINE HCL 5 MG PO TABS: 5.0000 mg | ORAL_TABLET | Freq: Three times a day (TID) | ORAL | Status: DC | PRN

## 2015-08-26 NOTE — Telephone Encounter (Signed)
-----   Message from Mar Daring, [place] sent at 08/26/2015  1:31 PM EDT ----- CXR is normal. If SOB worsens, feeling heart racing, lightheaded, please call 9-1-1 and go to hospital. Will continue treatment for muscle strain/spasm for now.

## 2015-08-26 NOTE — Patient Instructions (Signed)

## 2015-08-26 NOTE — Telephone Encounter (Signed)
Patient advised as directed below.  Thanks,  -Jailey Booton 

## 2015-08-26 NOTE — Progress Notes (Signed)
Patient: Faith Thompson Female    DOB: December 14, 1945   70 y.o.   MRN: [id number] Visit Date: 08/26/2015  Today's Provider: Mar Daring, PA-C   Chief Complaint  Patient presents with  . Pulled muscle   Subjective:    HPI Patient is here today with complaints of pulled muscle on the upper right side of abdomen since Thrusday. She did not do anything. Noticed pain Thursday night during supper. Had thought it was indigestion from soda but never went away. She reports that her pain is 9/10 when breathing deep or moving, especially in the evenings. She reports that she has SOB. She has been treating it with Tylenol with some relief. She denies cough, abdominal pain, diarrhea, constipation, nausea, vomiting, chest pain, lower extremity edema. Has had cholecystectomy in '93 and does have known hepatic steatosis. Most recent liver enzymes had been normal. Patient also had CT with urology recently and liver, kidneys, ureters, intestines and bladder were all WNL with exception of fatty liver as already mentioned.     Allergies  Allergen Reactions  . Codeine Itching  . Adhesive [Tape] Rash    Any tape - paper, bandaid   Current Meds  Medication Sig  . calcium carbonate (OS-CAL) 600 MG TABS Take 600 mg by mouth 2 (two) times daily with a meal.  . cetirizine (ZYRTEC) 10 MG tablet Take 10 mg by mouth daily.  . fish oil-omega-3 fatty acids 1000 MG capsule Take 2 g by mouth daily.  . Lutein 6 MG CAPS Take by mouth daily.  . mirabegron ER (MYRBETRIQ) 50 MG TB24 tablet Take 50 mg by mouth.  Marland Kitchen omeprazole (PRILOSEC) 20 MG capsule Take 1 capsule (20 mg total) by mouth daily.  Marland Kitchen OVER THE COUNTER MEDICATION Redcaps for eyes  . oxybutynin (DITROPAN-XL) 10 MG 24 hr tablet Take 1 tablet (10 mg total) by mouth at bedtime.  . sertraline (ZOLOFT) 50 MG tablet TAKE ONE TABLET BY MOUTH EVERY DAY  . sucralfate (CARAFATE) 1 G tablet Take 1 g by mouth 2 (two) times daily.    Review of Systems    Constitutional: Negative for fever, chills and fatigue.  HENT: Negative for rhinorrhea, sinus pressure, sneezing, sore throat, tinnitus and trouble swallowing.   Respiratory: Positive for shortness of breath. Negative for cough, chest tightness and wheezing.   Cardiovascular: Negative for chest pain, palpitations and leg swelling.  Gastrointestinal: Positive for abdominal pain (right upper side of abdomen). Negative for nausea, diarrhea, constipation and blood in stool.  Genitourinary: Positive for frequency. Negative for dysuria, hematuria, flank pain and pelvic pain.  Musculoskeletal: Negative for back pain.  Neurological: Negative for weakness, numbness and headaches.    Social History  Substance Use Topics  . Smoking status: Never Smoker   . Smokeless tobacco: Not on file  . Alcohol Use: 0.0 oz/week    0 Standard drinks or equivalent per week     Comment: occasional very rarely social events   Objective:   BP 130/80 mmHg  Pulse 77  Temp(Src) 98.4 F (36.9 C) (Oral)  Resp 16  Wt 265 lb (120.203 kg)  Physical Exam  Constitutional: She appears well-developed and well-nourished. No distress.  Neck: Normal range of motion. Neck supple. No JVD present. No tracheal deviation present. No thyromegaly present.  Cardiovascular: Normal rate, regular rhythm and normal heart sounds.  Exam reveals no gallop and no friction rub.   No murmur heard. Pulmonary/Chest: Effort normal and breath sounds normal.  No respiratory distress. She has no wheezes. She has no rales. She exhibits tenderness (tenderness over right lower ribs and costal angle some right upper abdomen pain).  Abdominal: Soft. Bowel sounds are normal. She exhibits no shifting dullness, no distension, no fluid wave, no ascites and no mass. There is tenderness in the right upper quadrant. There is no rebound and no guarding.  Lymphadenopathy:    She has no cervical adenopathy.  Skin: She is not diaphoretic.  Vitals reviewed.      Assessment & Plan:     1. RUQ abdominal pain Will check labs to check liver and pancreatic function. Will get CXR to R/O rib fracture and pneumonia. Will f/u pending results. She does have pain with movement, especially when she went from lying to sitting activating the abdominal muscles which leads me to feel this is more a muscle spasm/strain. Will give meloxicam and flexeril as below for muscle strain/spasm unless otherwise directed by CXR results. She is to call if symptoms worsen.  - CBC with Differential - Comprehensive Metabolic Panel (CMET) - Lipase - DG Chest 2 View; Future  2. Muscle spasm See above medical treatment plan. - cyclobenzaprine (FLEXERIL) 5 MG tablet; Take 1 tablet (5 mg total) by mouth 3 (three) times daily as needed for muscle spasms.  Dispense: 30 tablet; Refill: 1 - meloxicam (MOBIC) 7.5 MG tablet; Take 1 tablet (7.5 mg total) by mouth daily.  Dispense: 15 tablet; Refill: 0  3. Costochondritis See above medical treatment plan. - meloxicam (MOBIC) 7.5 MG tablet; Take 1 tablet (7.5 mg total) by mouth daily.  Dispense: 15 tablet; Refill: 0       Mar Daring, PA-C  [facility]

## 2015-08-27 LAB — CBC WITH DIFFERENTIAL/PLATELET
BASOS ABS: 0 10*3/uL (ref 0.0–0.2)
Basos: 1 %
EOS (ABSOLUTE): 0.1 10*3/uL (ref 0.0–0.4)
Eos: 2 %
Hematocrit: 33.9 % — ABNORMAL LOW (ref 34.0–46.6)
Hemoglobin: 10.5 g/dL — ABNORMAL LOW (ref 11.1–15.9)
IMMATURE GRANS (ABS): 0 10*3/uL (ref 0.0–0.1)
IMMATURE GRANULOCYTES: 0 %
LYMPHS: 41 %
Lymphocytes Absolute: 2.4 10*3/uL (ref 0.7–3.1)
MCH: 24.1 pg — ABNORMAL LOW (ref 26.6–33.0)
MCHC: 31 g/dL — ABNORMAL LOW (ref 31.5–35.7)
MCV: 78 fL — AB (ref 79–97)
MONOS ABS: 0.3 10*3/uL (ref 0.1–0.9)
Monocytes: 5 %
NEUTROS PCT: 51 %
Neutrophils Absolute: 3 10*3/uL (ref 1.4–7.0)
PLATELETS: 355 10*3/uL (ref 150–379)
RBC: 4.36 x10E6/uL (ref 3.77–5.28)
RDW: 15.4 % (ref 12.3–15.4)
WBC: 5.8 10*3/uL (ref 3.4–10.8)

## 2015-08-27 LAB — COMPREHENSIVE METABOLIC PANEL
A/G RATIO: 1.6 (ref 1.2–2.2)
ALT: 25 IU/L (ref 0–32)
AST: 33 IU/L (ref 0–40)
Albumin: 4.2 g/dL (ref 3.5–4.8)
Alkaline Phosphatase: 111 IU/L (ref 39–117)
BILIRUBIN TOTAL: 0.3 mg/dL (ref 0.0–1.2)
BUN / CREAT RATIO: 23 (ref 12–28)
BUN: 14 mg/dL (ref 8–27)
CALCIUM: 9.2 mg/dL (ref 8.7–10.3)
CO2: 24 mmol/L (ref 18–29)
Chloride: 103 mmol/L (ref 96–106)
Creatinine, Ser: 0.62 mg/dL (ref 0.57–1.00)
GFR calc Af Amer: 106 mL/min/{1.73_m2} (ref >59)
GFR, EST NON AFRICAN AMERICAN: 92 mL/min/{1.73_m2} (ref >59)
GLUCOSE: 88 mg/dL (ref 65–99)
Globulin, Total: 2.6 g/dL (ref 1.5–4.5)
Potassium: 4.3 mmol/L (ref 3.5–5.2)
SODIUM: 144 mmol/L (ref 134–144)
TOTAL PROTEIN: 6.8 g/dL (ref 6.0–8.5)

## 2015-08-27 LAB — LIPASE: Lipase: 40 U/L (ref 0–59)

## 2015-08-28 ENCOUNTER — Telehealth: Payer: Self-pay

## 2015-08-28 NOTE — Telephone Encounter (Signed)
Patient advised as directed below. Scheduled for tomorrow at 1:30  Thanks,  -Ferrel Simington

## 2015-08-28 NOTE — Telephone Encounter (Signed)
-----   Message from Mar Daring, [place] sent at 08/28/2015  8:57 AM EDT ----- Blood count has decreased showing anemia now. With having the abdominal pain I recommend you come and either get hemoccult cards to complete and bring back for testing or schedule an appt and we will check in office to make sure you are not losing blood through your GI tract.

## 2015-08-28 NOTE — Telephone Encounter (Signed)
LMTCB  Thanks,  -Joseline 

## 2015-08-28 NOTE — Telephone Encounter (Signed)
Pt is returning call.  [id number]

## 2015-08-29 ENCOUNTER — Ambulatory Visit (INDEPENDENT_AMBULATORY_CARE_PROVIDER_SITE_OTHER): Payer: Medicare Other | Admitting: Physician Assistant

## 2015-08-29 ENCOUNTER — Encounter: Payer: Self-pay | Admitting: Physician Assistant

## 2015-08-29 VITALS — BP 128/70 | HR 94 | Temp 98.2°F | Resp 16

## 2015-08-29 DIAGNOSIS — Q2733 Arteriovenous malformation of digestive system vessel: Secondary | ICD-10-CM | POA: Diagnosis not present

## 2015-08-29 DIAGNOSIS — D509 Iron deficiency anemia, unspecified: Secondary | ICD-10-CM

## 2015-08-29 DIAGNOSIS — K31819 Angiodysplasia of stomach and duodenum without bleeding: Secondary | ICD-10-CM | POA: Diagnosis not present

## 2015-08-29 DIAGNOSIS — K5521 Angiodysplasia of colon with hemorrhage: Secondary | ICD-10-CM

## 2015-08-29 LAB — IFOBT (OCCULT BLOOD): IMMUNOLOGICAL FECAL OCCULT BLOOD TEST: POSITIVE

## 2015-08-29 NOTE — Progress Notes (Signed)
Patient: Faith Thompson Female    DOB: 1945-06-22   70 y.o.   MRN: [id number] Visit Date: 08/29/2015  Today's Provider: Mar Daring, PA-C   Chief Complaint  Patient presents with  . Follow-up    Labs   Subjective:    HPI Patient is here to follow-up on labs. She was seen on 07/03.She reports that she is doing better and breathing better. She has had a h/o AVMs approx 4 years ago when she was being followed by Dr. Tamala Julian. She has also had iron-def anemia. She was previously on iron supplement until approx. 8-9 months ago. She cannot remember when she discontinued taking it. HgB had been holding steady at 12 until most recent labs where it dropped to 10.5. She denies any increased fatigue, cold intolerance, no more SOB or DOE, melana or hematochezia.     Allergies  Allergen Reactions  . Codeine Itching  . Adhesive [Tape] Rash    Any tape - paper, bandaid   Current Meds  Medication Sig  . calcium carbonate (OS-CAL) 600 MG TABS Take 600 mg by mouth 2 (two) times daily with a meal.  . cetirizine (ZYRTEC) 10 MG tablet Take 10 mg by mouth daily.  . cyclobenzaprine (FLEXERIL) 5 MG tablet Take 1 tablet (5 mg total) by mouth 3 (three) times daily as needed for muscle spasms.  . fish oil-omega-3 fatty acids 1000 MG capsule Take 2 g by mouth daily.  . Lutein 6 MG CAPS Take by mouth daily.  . meloxicam (MOBIC) 7.5 MG tablet Take 1 tablet (7.5 mg total) by mouth daily.  . mirabegron ER (MYRBETRIQ) 50 MG TB24 tablet Take 50 mg by mouth.  Marland Kitchen omeprazole (PRILOSEC) 20 MG capsule Take 1 capsule (20 mg total) by mouth daily.  Marland Kitchen OVER THE COUNTER MEDICATION Redcaps for eyes  . oxybutynin (DITROPAN-XL) 10 MG 24 hr tablet Take 1 tablet (10 mg total) by mouth at bedtime.  . sertraline (ZOLOFT) 50 MG tablet TAKE ONE TABLET BY MOUTH EVERY DAY  . sucralfate (CARAFATE) 1 G tablet Take 1 g by mouth 2 (two) times daily.    Review of Systems  Constitutional: Negative.   Respiratory: Negative.     Cardiovascular: Negative.   Gastrointestinal: Negative.   Musculoskeletal: Negative.   Neurological: Negative.     Social History  Substance Use Topics  . Smoking status: Never Smoker   . Smokeless tobacco: Not on file  . Alcohol Use: 0.0 oz/week    0 Standard drinks or equivalent per week     Comment: occasional very rarely social events   Objective:   BP 128/70 mmHg  Pulse 94  Temp(Src) 98.2 F (36.8 C) (Oral)  Resp 16  Wt   Physical Exam  Constitutional: She appears well-developed and well-nourished. No distress.  Neck: Normal range of motion. Neck supple. No tracheal deviation present. No thyromegaly present.  Cardiovascular: Normal rate, regular rhythm and normal heart sounds.  Exam reveals no gallop and no friction rub.   No murmur heard. Pulmonary/Chest: Effort normal and breath sounds normal. No respiratory distress. She has no wheezes. She has no rales.  Abdominal: Soft. Bowel sounds are normal. She exhibits no distension and no mass. There is no tenderness. There is no rebound and no guarding.  Lymphadenopathy:    She has no cervical adenopathy.  Skin: She is not diaphoretic.  Vitals reviewed.     Assessment & Plan:     1. AVM (arteriovenous  malformation) of stomach, acquired H/O this with a recent slight drop in HgB to 10.5 from 12.0. She had RUQ pain with initial visit which prompted labs. RUQ pain has improved today. She has previously been seen by Dr. Oneta Rack and would like to continue care with him, thus I will refer to [place] for further evaluation. OC-Lite today in the office was positive for blood in the stool. No BRBPR noted. No melena. Will have her restart ferrous sulfate 325mg . I will see her back in mid-August and we will recheck CBC at that time as well. She is to call if symptoms worsen. - Ambulatory referral to Gastroenterology  2. AVM (arteriovenous malformation) of colon with hemorrhage See above medical treatment plan. -  Ambulatory referral to Gastroenterology  3. Iron deficiency anemia See above medical treatment plan. - Ambulatory referral to Gastroenterology - IFOBT POC (occult bld, rslt in office)       Mar Daring, PA-C  [facility]

## 2015-08-29 NOTE — Patient Instructions (Signed)
Iron Deficiency Anemia, Adult Anemia is a condition in which there are less red blood cells or hemoglobin in the blood than normal. Hemoglobin is the part of red blood cells that carries oxygen. Iron deficiency anemia is anemia caused by too little iron. It is the most common type of anemia. It may leave you tired and short of breath. CAUSES   Lack of iron in the diet.  Poor absorption of iron, as seen with intestinal disorders.  Intestinal bleeding.  Heavy periods. SIGNS AND SYMPTOMS  Mild anemia may not be noticeable. Symptoms may include:  Fatigue.  Headache.  Pale skin.  Weakness.  Tiredness.  Shortness of breath.  Dizziness.  Cold hands and feet.  Fast or irregular heartbeat. DIAGNOSIS  Diagnosis requires a thorough evaluation and physical exam by your health care provider. Blood tests are generally used to confirm iron deficiency anemia. Additional tests may be done to find the underlying cause of your anemia. These may include:  Testing for blood in the stool (fecal occult blood test).  A procedure to see inside the colon and rectum (colonoscopy).  A procedure to see inside the esophagus and stomach (endoscopy). TREATMENT  Iron deficiency anemia is treated by correcting the cause of the deficiency. Treatment may involve:  Adding iron-rich foods to your diet.  Taking iron supplements. Pregnant or breastfeeding women need to take extra iron because their normal diet usually does not provide the required amount.  Taking vitamins. Vitamin C improves the absorption of iron. Your health care provider may recommend that you take your iron tablets with a glass of orange juice or vitamin C supplement.  Medicines to make heavy menstrual flow lighter.  Surgery. HOME CARE INSTRUCTIONS   Take iron as directed by your health care provider.  If you cannot tolerate taking iron supplements by mouth, talk to your health care provider about taking them through a vein  (intravenously) or an injection into a muscle.  For the best iron absorption, iron supplements should be taken on an empty stomach. If you cannot tolerate them on an empty stomach, you may need to take them with food.  Do not drink milk or take antacids at the same time as your iron supplements. Milk and antacids may interfere with the absorption of iron.  Iron supplements can cause constipation. Make sure to include fiber in your diet to prevent constipation. A stool softener may also be recommended.  Take vitamins as directed by your health care provider.  Eat a diet rich in iron. Foods high in iron include liver, lean beef, whole-grain bread, eggs, dried fruit, and dark green leafy vegetables. SEEK IMMEDIATE MEDICAL CARE IF:   You faint. If this happens, do not drive. Call your local emergency services (911 in [place] if no other help is available.  You have chest pain.  You feel nauseous or vomit.  You have severe or increased shortness of breath with activity.  You feel weak.  You have a rapid heartbeat.  You have unexplained sweating.  You become light-headed when getting up from a chair or bed. MAKE SURE YOU:   Understand these instructions.  Will watch your condition.  Will get help right away if you are not doing well or get worse.   This information is not intended to replace advice given to you by your health care provider. Make sure you discuss any questions you have with your health care provider.   Document Released: 02/07/2000 Document Revised: 03/02/2014 Document Reviewed: 10/17/2012 Elsevier   Interactive Patient Education 2016 [employer]  

## 2015-09-08 ENCOUNTER — Emergency Department
Admission: EM | Admit: 2015-09-08 | Discharge: 2015-09-08 | Disposition: A | Discharge status: Other Acute Inpatient Hospital | Payer: Medicare Other | Attending: Emergency Medicine | Admitting: Emergency Medicine

## 2015-09-08 ENCOUNTER — Encounter: Payer: Self-pay | Admitting: Emergency Medicine

## 2015-09-08 ENCOUNTER — Ambulatory Visit (HOSPITAL_COMMUNITY)
Admission: AD | Admit: 2015-09-08 | Discharge: 2015-09-08 | Disposition: A | Discharge status: Home / Self Care | Payer: Medicare Other | Source: Other Acute Inpatient Hospital | Attending: Emergency Medicine | Admitting: Emergency Medicine

## 2015-09-08 DIAGNOSIS — Z85828 Personal history of other malignant neoplasm of skin: Secondary | ICD-10-CM | POA: Diagnosis not present

## 2015-09-08 DIAGNOSIS — Z79899 Other long term (current) drug therapy: Secondary | ICD-10-CM | POA: Insufficient documentation

## 2015-09-08 DIAGNOSIS — K219 Gastro-esophageal reflux disease without esophagitis: Secondary | ICD-10-CM | POA: Diagnosis not present

## 2015-09-08 DIAGNOSIS — F329 Major depressive disorder, single episode, unspecified: Secondary | ICD-10-CM | POA: Diagnosis present

## 2015-09-08 DIAGNOSIS — R112 Nausea with vomiting, unspecified: Secondary | ICD-10-CM | POA: Diagnosis not present

## 2015-09-08 DIAGNOSIS — I350 Nonrheumatic aortic (valve) stenosis: Secondary | ICD-10-CM | POA: Diagnosis present

## 2015-09-08 DIAGNOSIS — R0989 Other specified symptoms and signs involving the circulatory and respiratory systems: Secondary | ICD-10-CM | POA: Diagnosis not present

## 2015-09-08 DIAGNOSIS — K922 Gastrointestinal hemorrhage, unspecified: Secondary | ICD-10-CM | POA: Insufficient documentation

## 2015-09-08 DIAGNOSIS — K920 Hematemesis: Secondary | ICD-10-CM

## 2015-09-08 DIAGNOSIS — N63 Unspecified lump in breast: Secondary | ICD-10-CM | POA: Diagnosis not present

## 2015-09-08 DIAGNOSIS — Z8711 Personal history of peptic ulcer disease: Secondary | ICD-10-CM | POA: Diagnosis not present

## 2015-09-08 DIAGNOSIS — G4733 Obstructive sleep apnea (adult) (pediatric): Secondary | ICD-10-CM | POA: Diagnosis present

## 2015-09-08 DIAGNOSIS — N3946 Mixed incontinence: Secondary | ICD-10-CM | POA: Diagnosis present

## 2015-09-08 DIAGNOSIS — R42 Dizziness and giddiness: Secondary | ICD-10-CM | POA: Diagnosis not present

## 2015-09-08 DIAGNOSIS — Z9989 Dependence on other enabling machines and devices: Secondary | ICD-10-CM | POA: Diagnosis not present

## 2015-09-08 DIAGNOSIS — R61 Generalized hyperhidrosis: Secondary | ICD-10-CM | POA: Diagnosis not present

## 2015-09-08 DIAGNOSIS — K31811 Angiodysplasia of stomach and duodenum with bleeding: Secondary | ICD-10-CM | POA: Diagnosis not present

## 2015-09-08 DIAGNOSIS — M1990 Unspecified osteoarthritis, unspecified site: Secondary | ICD-10-CM | POA: Diagnosis present

## 2015-09-08 DIAGNOSIS — D649 Anemia, unspecified: Secondary | ICD-10-CM | POA: Diagnosis not present

## 2015-09-08 DIAGNOSIS — D6851 Activated protein C resistance: Secondary | ICD-10-CM | POA: Diagnosis present

## 2015-09-08 DIAGNOSIS — J45909 Unspecified asthma, uncomplicated: Secondary | ICD-10-CM | POA: Diagnosis present

## 2015-09-08 DIAGNOSIS — D62 Acute posthemorrhagic anemia: Secondary | ICD-10-CM | POA: Diagnosis present

## 2015-09-08 DIAGNOSIS — H3530 Unspecified macular degeneration: Secondary | ICD-10-CM | POA: Diagnosis present

## 2015-09-08 LAB — CBC
HEMATOCRIT: 28.5 % — AB (ref 35.0–47.0)
HEMOGLOBIN: 9.1 g/dL — AB (ref 12.0–16.0)
MCH: 24.3 pg — AB (ref 26.0–34.0)
MCHC: 31.9 g/dL — ABNORMAL LOW (ref 32.0–36.0)
MCV: 76.3 fL — AB (ref 80.0–100.0)
Platelets: 324 10*3/uL (ref 150–440)
RBC: 3.73 MIL/uL — AB (ref 3.80–5.20)
RDW: 16.7 % — ABNORMAL HIGH (ref 11.5–14.5)
WBC: 11.3 10*3/uL — ABNORMAL HIGH (ref 3.6–11.0)

## 2015-09-08 LAB — TROPONIN I: Troponin I: <0.03 ng/mL (ref <0.03)

## 2015-09-08 LAB — COMPREHENSIVE METABOLIC PANEL
ALT: 16 U/L (ref 14–54)
ANION GAP: 8 (ref 5–15)
AST: 20 U/L (ref 15–41)
Albumin: 3.2 g/dL — ABNORMAL LOW (ref 3.5–5.0)
Alkaline Phosphatase: 73 U/L (ref 38–126)
BUN: 23 mg/dL — ABNORMAL HIGH (ref 6–20)
CHLORIDE: 108 mmol/L (ref 101–111)
CO2: 26 mmol/L (ref 22–32)
CREATININE: 0.82 mg/dL (ref 0.44–1.00)
Calcium: 8.4 mg/dL — ABNORMAL LOW (ref 8.9–10.3)
GFR calc non Af Amer: >60 mL/min (ref >60)
Glucose, Bld: 145 mg/dL — ABNORMAL HIGH (ref 65–99)
POTASSIUM: 3.6 mmol/L (ref 3.5–5.1)
SODIUM: 142 mmol/L (ref 135–145)
Total Bilirubin: 0.7 mg/dL (ref 0.3–1.2)
Total Protein: 5.8 g/dL — ABNORMAL LOW (ref 6.5–8.1)

## 2015-09-08 LAB — TYPE AND SCREEN
ABO/RH(D): A NEG
ANTIBODY SCREEN: NEGATIVE

## 2015-09-08 LAB — LIPASE, BLOOD: LIPASE: 37 U/L (ref 11–51)

## 2015-09-08 LAB — PROTIME-INR
INR: 1.09
Prothrombin Time: 14.3 seconds (ref 11.4–15.0)

## 2015-09-08 MED ORDER — SODIUM CHLORIDE 0.9 % IV BOLUS (SEPSIS)
1000.0000 mL | Freq: Once | INTRAVENOUS | Status: AC
  Administered 2015-09-08: 1000 mL via INTRAVENOUS

## 2015-09-08 MED ORDER — SODIUM CHLORIDE 0.9 % IV SOLN
8.0000 mg/h | INTRAVENOUS | Status: DC
  Administered 2015-09-08: 8 mg/h via INTRAVENOUS
  Filled 2015-09-08: qty 80

## 2015-09-08 MED ORDER — PANTOPRAZOLE SODIUM 40 MG IV SOLR: 40.0000 mg | Freq: Two times a day (BID) | INTRAVENOUS | Status: DC

## 2015-09-08 MED ORDER — SODIUM CHLORIDE 0.9 % IV SOLN
80.0000 mg | Freq: Once | INTRAVENOUS | Status: AC
  Administered 2015-09-08: 80 mg via INTRAVENOUS
  Filled 2015-09-08: qty 80

## 2015-09-08 NOTE — ED Notes (Signed)
[facility] reports that pt called in due to hematemesis. Pt reports throwing up large amount of frank blood x1. Pt reports feeling light headed and dizzy at this time. Pt has GI bleed diagnosed through PCP and has appointment for Monday with GI Dr. [name] is alert and oriented.

## 2015-09-08 NOTE — ED Notes (Signed)
Patient unable to void on bed pan

## 2015-09-08 NOTE — ED Notes (Signed)
[facility] at bedside to transport patient

## 2015-09-08 NOTE — ED Provider Notes (Signed)
[facility] Emergency Department Provider Note   ____________________________________________  Time seen: Approximately 5:55 AM  I have reviewed the triage vital signs and the nursing notes.   HISTORY  Chief Complaint Hematemesis    HPI Faith Thompson is a 70 y.o. female who presents to the ED from home via EMS with a chief complaint of hematemesis. Patient has a history of gastric ulcer, AVM of colon who recently saw her PCP and diagnosed with GI bleed by complains of bloody stool with decreasing hemoglobin. She is supposed to have an appointment with her GI doctor on Monday. Patient reports feeling unwell all night; she was unable to sleep. States she vomited a large amount of bloody emesis prior to arrival. Currently feeling lightheaded and dizzy. She was given 4 mg IV Zofran per EMS. Denies associated chest pain, shortness of breath, abdominal pain, diarrhea. Denies anticoagulant use. Denies recent travel or trauma. Nothing makes her symptoms better or worse.   Past Medical History  Diagnosis Date  . Anemia   . Asthma   . Depression   . Ulcer   . Overactive bladder   . Allergic rhinitis, cause unspecified   . GERD (gastroesophageal reflux disease)   . Factor V Leiden mutation ([place])   . Obesity, unspecified   . Dysthymic disorder   . Macular degeneration (senile) of retina, unspecified   . Gastritis 11/24/2011    EGD: +gastritis; AVM gastric region, pulsatile mass in stomach  . 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  . 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  . Basal cell carcinoma 06/09/12    R facial region; s/p Mohs procedure at [facility].  . OSA on CPAP 05/24/2012    sleep study +OSA; CPAP titration placed.    Patient Active Problem List   Diagnosis Date Noted  . Restless leg syndrome 10/11/2012  .  OSA on CPAP 09/28/2012  . Routine general medical examination at a health care facility 09/28/2012  . Urinary incontinence 02/29/2012  . Snoring 02/29/2012  . Hypersomnolence 02/29/2012  . Overactive bladder 02/09/2012  . Insomnia 02/09/2012  . AVM (arteriovenous malformation) of stomach, acquired 02/09/2012  . Gastritis 02/09/2012  . Depression 02/09/2012  . Iron deficiency anemia 02/09/2012  . AVM (arteriovenous malformation) of colon with hemorrhage 02/09/2012  . ANXIETY, SITUATIONAL 10/13/2006  . FACTOR V DEFICIENCY 10/06/2006  . Macular degeneration (senile) of retina 10/06/2006  . SYMPTOM, DISTURBANCE, SLEEP NOS 10/06/2006  . Obesity 06/10/2006  . Allergic rhinitis 06/10/2006  . GERD 06/10/2006  . OSTEOARTHRITIS 06/10/2006    Past Surgical History  Procedure Laterality Date  . Tonsillectomy  age 36  . Cholecystectomy  1993  . Colonoscopy  10/2011    AVM bleeding in colon s/p cauterization.  Symptoms: anemia, hemoccult +.  . Esophagogastroduodenoscopy  10/2011    gastritis, AVM of stomach non-bleeding, pulsatile mass of stomach.  Symptoms: iron defficiency anemia with hemocult +.  . Esophagogastroduodenoscopy  [id number]    NL esophagus.-Bile gastritis.This was biopsied, pulsatile extrinsic mass at the bulb.A few non bleeding angioectasias in the stomach. Sucralfate tab at 1 gram po 2x day indefintely. repeat upper endoscopy prn for surveillance. Schedule an ultrasound of the abdomen to look for aneurysm of gastroduodenal artery.  . Sleep study  05/24/2012    +OSA; CPAP fitted.  . Abdominal hysterectomy  1996    fibroids; ovaries  removed.      Current Outpatient Rx  Name  Route  Sig  Dispense  Refill  . calcium carbonate (OS-CAL) 600 MG TABS   Oral   Take 600 mg by mouth 2 (two) times daily with a meal.         . cetirizine (ZYRTEC) 10 MG tablet   Oral   Take 10 mg by mouth daily.         . ferrous sulfate 325 (65 FE) MG tablet   Oral   Take 325 mg by mouth daily  with breakfast.         . gabapentin (NEURONTIN) 300 MG capsule   Oral   Take 300 mg by mouth at bedtime.          . Lutein 6 MG CAPS   Oral   Take by mouth daily.         . mirabegron ER (MYRBETRIQ) 50 MG TB24 tablet   Oral   Take 50 mg by mouth.         . Multiple Vitamins-Minerals (I-VITE) TABS   Oral   Take 2 tablets by mouth 2 (two) times daily.         Marland Kitchen omeprazole (PRILOSEC) 20 MG capsule   Oral   Take 1 capsule (20 mg total) by mouth daily.   90 capsule   3   . sertraline (ZOLOFT) 50 MG tablet      TAKE ONE TABLET BY MOUTH EVERY DAY   90 tablet   3   . sucralfate (CARAFATE) 1 G tablet   Oral   Take 1 g by mouth 4 (four) times daily -  with meals and at bedtime.          . cyclobenzaprine (FLEXERIL) 5 MG tablet   Oral   Take 1 tablet (5 mg total) by mouth 3 (three) times daily as needed for muscle spasms. Patient not taking: Reported on 09/08/2015   30 tablet   1   . hydrocortisone 2.5 % ointment   Topical   Apply topically 2 (two) times daily. Patient not taking: Reported on 10/15/2014   30 g   0   . meloxicam (MOBIC) 7.5 MG tablet   Oral   Take 1 tablet (7.5 mg total) by mouth daily. Patient not taking: Reported on 09/08/2015   15 tablet   0   . oxybutynin (DITROPAN-XL) 10 MG 24 hr tablet   Oral   Take 1 tablet (10 mg total) by mouth at bedtime. Patient not taking: Reported on 09/08/2015   90 tablet   3     Allergies Codeine and Adhesive  Family History  Problem Relation Age of Onset  . Diabetes Mother   . Cancer Father     lung  . Hypertension Sister   . Arthritis Sister   . Heart disease Maternal Uncle   . Heart murmur Sister   . Stroke Paternal Grandmother   . Mental illness Sister     anxiety    Social History Social History  Substance Use Topics  . Smoking status: Never Smoker   . Smokeless tobacco: None  . Alcohol Use: 0.0 oz/week    0 Standard drinks or equivalent per week     Comment: occasional very  rarely social events    Review of Systems  Constitutional: No fever/chills. Eyes: No visual changes. ENT: No sore throat. Cardiovascular: Denies chest pain. Respiratory: Denies shortness of breath. Gastrointestinal: No abdominal pain.  Positive for  nausea and vomiting blood.  No diarrhea.  No constipation. Genitourinary: Negative for dysuria. Musculoskeletal: Negative for back pain. Skin: Negative for rash. Neurological: Negative for headaches, focal weakness or numbness.  10-point ROS otherwise negative.  ____________________________________________   PHYSICAL EXAM:  VITAL SIGNS: ED Triage Vitals  Enc Vitals Group     BP 09/08/15 0525 120/54 mmHg     Pulse Rate 09/08/15 0525 101     Resp 09/08/15 0525 11     Temp 09/08/15 0525 97.9 F (36.6 C)     Temp Source 09/08/15 0525 Oral     SpO2 09/08/15 0525 96 %     Weight 09/08/15 0525 262 lb (118.842 kg)     Height 09/08/15 0525 5\' 1"  (1.549 m)     Head Cir --      Peak Flow --      Pain Score 09/08/15 0523 0     Pain Loc --      Pain Edu? --      Excl. in GC? --     Constitutional: Alert and oriented. Ill-appearing and in mild acute distress. Eyes: Conjunctivae are normal. PERRL. EOMI. Head: Atraumatic. Nose: No congestion/rhinnorhea. Mouth/Throat: Mucous membranes are moist.  Oropharynx non-erythematous.  Coffee-ground emesis around oropharynx. Neck: No stridor.   Cardiovascular: Normal rate, regular rhythm. Grossly normal heart sounds.  Good peripheral circulation. Respiratory: Normal respiratory effort.  No retractions. Lungs CTAB. Gastrointestinal: Soft and mildly tender to palpation to epigastrium without rebound or guarding. No distention. No abdominal bruits. No CVA tenderness. Musculoskeletal: No lower extremity tenderness nor edema.  No joint effusions. Neurologic:  Normal speech and language. No gross focal neurologic deficits are appreciated. No gait instability. Skin:  Skin is warm, profusely diaphoretic  and intact. No rash noted. Psychiatric: Mood and affect are normal. Speech and behavior are normal.  ____________________________________________   LABS (all labs ordered are listed, but only abnormal results are displayed)  Labs Reviewed  COMPREHENSIVE METABOLIC PANEL - Abnormal; Notable for the following:    Glucose, Bld 145 (*)    BUN 23 (*)    Calcium 8.4 (*)    Total Protein 5.8 (*)    Albumin 3.2 (*)    All other components within normal limits  CBC - Abnormal; Notable for the following:    WBC 11.3 (*)    RBC 3.73 (*)    Hemoglobin 9.1 (*)    HCT 28.5 (*)    MCV 76.3 (*)    MCH 24.3 (*)    MCHC 31.9 (*)    RDW 16.7 (*)    All other components within normal limits  LIPASE, BLOOD  PROTIME-INR  TROPONIN I  URINALYSIS COMPLETEWITH MICROSCOPIC ([facility] ONLY)  POC OCCULT BLOOD, ED  TYPE AND SCREEN   ____________________________________________  EKG  ED ECG REPORT I, Serge Main J, the attending physician, personally viewed and interpreted this ECG.   Date: 09/08/2015  EKG Time: 0528  Rate: 92  Rhythm: normal EKG, normal sinus rhythm  Axis: Normal  Intervals:none  ST&T Change: Nonspecific  ____________________________________________  RADIOLOGY  None ____________________________________________   PROCEDURES  Procedure(s) performed:  Rectal exam: External exam WNL. Tan stool obtained on gloved finger which is slowly heme negative.  Procedures  Critical Care performed:   CRITICAL CARE Performed by: Irean HongSUNG,Senia Even J   Total critical care time: 30 minutes  Critical care time was exclusive of separately billable procedures and treating other patients.  Critical care was necessary to treat or prevent imminent or life-threatening deterioration.  Critical care was time spent personally by me on the following activities: development of treatment plan with patient and/or surrogate as well as nursing, discussions with consultants, evaluation of patient's  response to treatment, examination of patient, obtaining history from patient or surrogate, ordering and performing treatments and interventions, ordering and review of laboratory studies, ordering and review of radiographic studies, pulse oximetry and re-evaluation of patient's condition.  ____________________________________________   INITIAL IMPRESSION / ASSESSMENT AND PLAN / ED COURSE  Pertinent labs & imaging results that were available during my care of the patient were reviewed by me and considered in my medical decision making (see chart for details).  70 year old female with a history of gastric ulcers as well as AVM of her colon who presents with hematemesis. Patient is ill-appearing, diaphoretic with evidence of coffee-ground emesis in her oropharynx. Will place 2 large bore IVs, obtain screening lab work including type and screen, coagulation panel. Will initiate IV fluid resuscitation, Protonix bolus and drip. There is no GI coverage at this facility this weekend; patient is an established patient of Dr. Bonnita Nasuti at [facility]. She will require transfer to another facility.  ----------------------------------------- 6:07 AM on 09/08/2015 -----------------------------------------  H&H noted; H/H done by her PCP on 08/26/2015 were 10.5/33.9. [facility] transfer center contacted for transfer. Patient is no longer hot or diaphoretic. Currently resting in no acute distress. Remains hemodynamically stable.  ----------------------------------------- 7:00 AM on 09/08/2015 -----------------------------------------  Patient accepted by Dr. Evelena Leyden [facility] ED). Updated patient and family members. Patient remains hemodynamically stable.  ----------------------------------------- 8:04 AM on 09/08/2015 -----------------------------------------  [facility] at bedside to transport patient. No further emesis. BP stable. ____________________________________________   FINAL CLINICAL IMPRESSION(S) / ED  DIAGNOSES  Final diagnoses:  Hematemesis with nausea  Upper GI bleed  Anemia, unspecified anemia type      NEW MEDICATIONS STARTED DURING THIS VISIT:  New Prescriptions   No medications on file     Note:  This document was prepared using Dragon voice recognition software and may include unintentional dictation errors.    Paulette Blanch, MD 09/08/15 [phone]

## 2015-09-09 MED FILL — Ondansetron HCl Inj 4 MG/2ML (2 MG/ML): INTRAMUSCULAR | Qty: 2 | Status: AC

## 2015-09-10 ENCOUNTER — Telehealth: Payer: Self-pay | Admitting: Physician Assistant

## 2015-09-10 NOTE — Telephone Encounter (Signed)
Yes that works

## 2015-09-10 NOTE — Telephone Encounter (Signed)
I scheduled her for tomorrow with [place] at 11:15 . She will need her labs recheck in a week. I told them you would order those labs for her.  Thanks,  -Joseline

## 2015-09-10 NOTE — Telephone Encounter (Signed)
Pt is being discharged from [facility] today for throwing up blood.  I have scheduled a hospital follow up appointment for 09/16/2015/MW

## 2015-09-10 NOTE — Telephone Encounter (Signed)
Faith Thompson is this ok with you or is it ok if we see her tomorrow and I can open up the 11:15 and block the 11:30.

## 2015-09-11 ENCOUNTER — Inpatient Hospital Stay: Payer: Self-pay | Admitting: Physician Assistant

## 2015-09-12 ENCOUNTER — Encounter: Payer: Self-pay | Admitting: Physician Assistant

## 2015-09-12 ENCOUNTER — Ambulatory Visit (INDEPENDENT_AMBULATORY_CARE_PROVIDER_SITE_OTHER): Payer: Medicare Other | Admitting: Physician Assistant

## 2015-09-12 DIAGNOSIS — K31819 Angiodysplasia of stomach and duodenum without bleeding: Secondary | ICD-10-CM

## 2015-09-12 DIAGNOSIS — E669 Obesity, unspecified: Secondary | ICD-10-CM

## 2015-09-12 DIAGNOSIS — Q2733 Arteriovenous malformation of digestive system vessel: Secondary | ICD-10-CM | POA: Diagnosis not present

## 2015-09-12 MED ORDER — OMEPRAZOLE 20 MG PO CPDR: 40.0000 mg | DELAYED_RELEASE_CAPSULE | Freq: Two times a day (BID) | ORAL | Status: DC

## 2015-09-12 NOTE — Progress Notes (Signed)
Patient: Faith Thompson Female    DOB: [birth year]   70 y.o.   MRN: [id number] Visit Date: 09/12/2015  Today's Provider: Mar Daring, PA-C   Chief Complaint  Patient presents with  . Hospitalization Follow-up   Subjective:    HPI  Follow Up ER Visit  Patient is here for ER follow up.  She was recently seen at [facility] for Hematemesis with nausea on 07/16 and was transferred to [facility] for admission and got Discharged on 07/18. She was diagnosed with 5 AVMs that were treated successfully during EGD. She will be following up with [facility] GI. She was started on carafate and her prilosec was increased to 40mg  BID.  Patient reports that she needs her labs to be repeated within a week of the last labs that were drawned. She reports this condition is Improved. She reports she is still feeling very tired, but improving slowly every day. ------------------------------------------------------------------------------------     Allergies  Allergen Reactions  . Codeine Itching  . Adhesive [Tape] Rash    Any tape - paper, bandaid   Current Meds  Medication Sig  . calcium carbonate (OS-CAL) 600 MG TABS Take 600 mg by mouth 2 (two) times daily with a meal.  . cetirizine (ZYRTEC) 10 MG tablet Take 10 mg by mouth daily.  . ferrous sulfate 325 (65 FE) MG tablet Take 325 mg by mouth daily with breakfast.  . gabapentin (NEURONTIN) 300 MG capsule Take 300 mg by mouth at bedtime.   . Lutein 6 MG CAPS Take by mouth daily.  . Multiple Vitamins-Minerals (I-VITE) TABS Take 2 tablets by mouth 2 (two) times daily.  Marland Kitchen omeprazole (PRILOSEC) 20 MG capsule Take 1 capsule (20 mg total) by mouth daily.  . sertraline (ZOLOFT) 50 MG tablet TAKE ONE TABLET BY MOUTH EVERY DAY  . sucralfate (CARAFATE) 1 G tablet Take 1 g by mouth 4 (four) times daily -  with meals and at bedtime.     Review of Systems  Constitutional: Positive for fatigue. Negative for fever, chills and diaphoresis.  HENT: Negative.     Respiratory: Negative.   Cardiovascular: Negative.   Gastrointestinal: Negative.   Musculoskeletal: Negative.   Skin: Negative.   Neurological: Negative.   Psychiatric/Behavioral: Negative.     Social History  Substance Use Topics  . Smoking status: Never Smoker   . Smokeless tobacco: Not on file  . Alcohol Use: 0.0 oz/week    0 Standard drinks or equivalent per week     Comment: occasional very rarely social events   Objective:   There were no vitals taken for this visit.  Physical Exam  Constitutional: She appears well-developed and well-nourished. No distress.  Neck: Normal range of motion. Neck supple. No tracheal deviation present. No thyromegaly present.  Cardiovascular: Normal rate, regular rhythm and normal heart sounds.  Exam reveals no gallop and no friction rub.   No murmur heard. Pulmonary/Chest: Effort normal and breath sounds normal. No respiratory distress. She has no wheezes. She has no rales.  Abdominal: Soft. Bowel sounds are normal. She exhibits no distension and no mass. There is tenderness (very mild generalized). There is no rebound and no guarding.  Musculoskeletal: She exhibits no edema.  Lymphadenopathy:    She has no cervical adenopathy.  Skin: She is not diaphoretic.  Vitals reviewed.     Assessment & Plan:     1. Obesity Will refer to nutrition therapy for weight loss, lifestyle modifications and also to  educate patient on appropriate foods to eat following gastric ulcers.  - Amb ref to Medical Nutrition Therapy-MNT  2. Gastric AVM Changed prilosec Rx to new dose as below. Referral to nutritionist as above noted. Order placed for CBC recheck. This is to be done early next week. Will f/u pending these results.  - Amb ref to Medical Nutrition Therapy-MNT - CBC with Differential - omeprazole (PRILOSEC) 20 MG capsule; Take 2 capsules (40 mg total) by mouth 2 (two) times daily before a meal.  Dispense: 90 capsule; Refill: [place], PA-C  [facility]

## 2015-09-12 NOTE — Patient Instructions (Signed)

## 2015-09-16 ENCOUNTER — Inpatient Hospital Stay: Payer: Medicare Other | Admitting: Physician Assistant

## 2015-09-16 DIAGNOSIS — Q2733 Arteriovenous malformation of digestive system vessel: Secondary | ICD-10-CM | POA: Diagnosis not present

## 2015-09-17 ENCOUNTER — Telehealth: Payer: Self-pay

## 2015-09-17 LAB — CBC WITH DIFFERENTIAL/PLATELET
BASOS ABS: 0.1 10*3/uL (ref 0.0–0.2)
BASOS: 2 %
EOS (ABSOLUTE): 0.1 10*3/uL (ref 0.0–0.4)
EOS: 2 %
HEMATOCRIT: 33.5 % — AB (ref 34.0–46.6)
HEMOGLOBIN: 10.4 g/dL — AB (ref 11.1–15.9)
IMMATURE GRANS (ABS): 0 10*3/uL (ref 0.0–0.1)
Immature Granulocytes: 1 %
LYMPHS ABS: 2 10*3/uL (ref 0.7–3.1)
LYMPHS: 37 %
MCH: 24.8 pg — AB (ref 26.6–33.0)
MCHC: 31 g/dL — AB (ref 31.5–35.7)
MCV: 80 fL (ref 79–97)
MONOCYTES: 6 %
Monocytes Absolute: 0.4 10*3/uL (ref 0.1–0.9)
NEUTROS ABS: 2.9 10*3/uL (ref 1.4–7.0)
Neutrophils: 52 %
Platelets: 399 10*3/uL — ABNORMAL HIGH (ref 150–379)
RBC: 4.2 x10E6/uL (ref 3.77–5.28)
RDW: 17.8 % — ABNORMAL HIGH (ref 12.3–15.4)
WBC: 5.5 10*3/uL (ref 3.4–10.8)

## 2015-09-17 NOTE — Telephone Encounter (Signed)
Patient advised as directed below. Voiced understanding. She will let us know what the GI doctors advises.  Thanks,  -Joseline

## 2015-09-17 NOTE — Telephone Encounter (Signed)
-----   Message from Mar Daring, PA-C sent at 09/17/2015  8:28 AM EDT ----- HgB improving. Now 10.4. We can recheck again in 6-8 weeks unless GI wants it rechecked sooner.

## 2015-09-26 ENCOUNTER — Encounter: Payer: Medicare Other | Attending: Physician Assistant | Admitting: Dietician

## 2015-09-26 ENCOUNTER — Encounter: Payer: Self-pay | Admitting: Dietician

## 2015-09-26 DIAGNOSIS — G4730 Sleep apnea, unspecified: Secondary | ICD-10-CM | POA: Insufficient documentation

## 2015-09-26 DIAGNOSIS — E119 Type 2 diabetes mellitus without complications: Secondary | ICD-10-CM | POA: Diagnosis not present

## 2015-09-26 DIAGNOSIS — Q2739 Arteriovenous malformation, other site: Secondary | ICD-10-CM | POA: Diagnosis not present

## 2015-09-26 DIAGNOSIS — Z713 Dietary counseling and surveillance: Secondary | ICD-10-CM | POA: Insufficient documentation

## 2015-09-26 DIAGNOSIS — K219 Gastro-esophageal reflux disease without esophagitis: Secondary | ICD-10-CM | POA: Insufficient documentation

## 2015-09-26 DIAGNOSIS — R110 Nausea: Secondary | ICD-10-CM | POA: Diagnosis not present

## 2015-09-26 DIAGNOSIS — K920 Hematemesis: Secondary | ICD-10-CM | POA: Diagnosis not present

## 2015-09-26 DIAGNOSIS — D649 Anemia, unspecified: Secondary | ICD-10-CM | POA: Diagnosis not present

## 2015-09-26 NOTE — Patient Instructions (Signed)
   Control food portions by using smaller plates, eating slowly and chewing thoroughly, diverting thoughts to other things when you finish a plate, and/or putting the leftovers away immediately after dishing up your plate.   Keep drinking water and other sugar free beverages.   Gradually resume some regular exercise; check into water exercise program.

## 2015-09-26 NOTE — Progress Notes (Signed)
Medical Nutrition Therapy: Visit start time: 1500  end time: 1630  Assessment:  Diagnosis: obesity, gastric AVM Past medical history: GERD, sleep apnea Psychosocial issues/ stress concerns: none Preferred learning method:  . No preference indicated  Current weight: 256.9lbs  Height: 5'0" Medications, supplements: reviewed list in chart with patient  Progress and evaluation: Patient reports having lost about 30lbs when coming for RD visits several years ago, but has regained the weight since her husband retired, and physical activity decreased while eating habits deteriorated. She feels her eating habits have been pretty unhealthy until falling ill with gastric AVM. Since then she has made changes to decrease fried foods, limit or avoid red meats, and avoid spicy and acidic foods. .    Physical activity: none currently: knee issues, recent GI bleed with hospitalization  Dietary Intake:  Usual eating pattern includes 3 meals and 1 snacks per day. Dining out frequency: 0 meals per week.  Breakfast: cereal or oatmeal; occasionally eggs. Was drinking coffee, then eating breakfast late.  Snack: none Lunch: sandwich, or cottage cheese with fruit Snack: none Supper: broiled, stir fried chicken, tacos, salmon with rice and veg or salad, spaghetti with salad, bread; tuna salad with salad greens and tomatoes. Used to eat burgers, hot dogs with fries or chips, pizza and salad Snack: ice cream -- lately sherbet or italian ice or jello Beverages: water only. Stopped sodas and sweet tea    Nutrition Care Education: Topics covered: weight management Basic nutrition: basic food groups, appropriate nutrient balance, general nutrition guidelines    Weight control: benefits of weight control, guidance for 1400kcal daily intake, importance of low fat and low sugar choices, strategies for portion control, food choices for digestive health.  Other lifestyle changes:  Role of exercise in weight management and  overall health.  Nutritional Diagnosis:  [place]-3.3 Overweight/obesity As related to excess calories, inactivity.  As evidenced by patient report.  Intervention: Instruction as noted above.   Patient has made several changes to control calorie intake.    Set goals with patient's direction.   Education Materials given:  . Food lists/ Planning A Balanced Meal . Sample meal pattern/ menus . Goals/ instructions  Learner/ who was taught:  . Patient  . Spouse/ partner  Level of understanding: Marland Kitchen Verbalizes/ demonstrates competency  Demonstrated degree of understanding via:   Teach back Learning barriers: . None  Willingness to learn/ readiness for change: . Eager, change in progress  Monitoring and Evaluation:  Dietary intake, exercise, and body weight      follow up: 11/07/15

## 2015-10-15 DIAGNOSIS — R040 Epistaxis: Secondary | ICD-10-CM | POA: Diagnosis not present

## 2015-10-15 DIAGNOSIS — I780 Hereditary hemorrhagic telangiectasia: Secondary | ICD-10-CM | POA: Diagnosis not present

## 2015-10-16 ENCOUNTER — Other Ambulatory Visit: Payer: Self-pay | Admitting: Physician Assistant

## 2015-10-16 ENCOUNTER — Ambulatory Visit: Payer: Medicare Other | Admitting: Physician Assistant

## 2015-10-16 DIAGNOSIS — D509 Iron deficiency anemia, unspecified: Secondary | ICD-10-CM

## 2015-10-16 DIAGNOSIS — K5521 Angiodysplasia of colon with hemorrhage: Secondary | ICD-10-CM

## 2015-10-16 DIAGNOSIS — K31819 Angiodysplasia of stomach and duodenum without bleeding: Secondary | ICD-10-CM

## 2015-10-29 DIAGNOSIS — Q2733 Arteriovenous malformation of digestive system vessel: Secondary | ICD-10-CM | POA: Diagnosis not present

## 2015-10-29 DIAGNOSIS — K31819 Angiodysplasia of stomach and duodenum without bleeding: Secondary | ICD-10-CM | POA: Diagnosis not present

## 2015-10-29 DIAGNOSIS — H353132 Nonexudative age-related macular degeneration, bilateral, intermediate dry stage: Secondary | ICD-10-CM | POA: Diagnosis not present

## 2015-10-29 DIAGNOSIS — D509 Iron deficiency anemia, unspecified: Secondary | ICD-10-CM | POA: Diagnosis not present

## 2015-10-30 DIAGNOSIS — R040 Epistaxis: Secondary | ICD-10-CM | POA: Diagnosis not present

## 2015-10-30 DIAGNOSIS — I780 Hereditary hemorrhagic telangiectasia: Secondary | ICD-10-CM | POA: Diagnosis not present

## 2015-10-30 LAB — CBC WITH DIFFERENTIAL/PLATELET
BASOS ABS: 0 10*3/uL (ref 0.0–0.2)
Basos: 1 %
EOS (ABSOLUTE): 0.1 10*3/uL (ref 0.0–0.4)
Eos: 2 %
Hematocrit: 35.5 % (ref 34.0–46.6)
Hemoglobin: 11.3 g/dL (ref 11.1–15.9)
IMMATURE GRANS (ABS): 0 10*3/uL (ref 0.0–0.1)
IMMATURE GRANULOCYTES: 0 %
LYMPHS: 45 %
Lymphocytes Absolute: 2 10*3/uL (ref 0.7–3.1)
MCH: 25 pg — AB (ref 26.6–33.0)
MCHC: 31.8 g/dL (ref 31.5–35.7)
MCV: 79 fL (ref 79–97)
Monocytes Absolute: 0.3 10*3/uL (ref 0.1–0.9)
Monocytes: 6 %
NEUTROS ABS: 2.1 10*3/uL (ref 1.4–7.0)
NEUTROS PCT: 46 %
PLATELETS: 315 10*3/uL (ref 150–379)
RBC: 4.52 x10E6/uL (ref 3.77–5.28)
RDW: 17.7 % — ABNORMAL HIGH (ref 12.3–15.4)
WBC: 4.5 10*3/uL (ref 3.4–10.8)

## 2015-10-30 LAB — BASIC METABOLIC PANEL
BUN / CREAT RATIO: 24 (ref 12–28)
BUN: 15 mg/dL (ref 8–27)
CHLORIDE: 103 mmol/L (ref 96–106)
CO2: 25 mmol/L (ref 18–29)
Calcium: 9.3 mg/dL (ref 8.7–10.3)
Creatinine, Ser: 0.62 mg/dL (ref 0.57–1.00)
GFR calc Af Amer: 106 mL/min/{1.73_m2} (ref >59)
GFR calc non Af Amer: 92 mL/min/{1.73_m2} (ref >59)
GLUCOSE: 92 mg/dL (ref 65–99)
Potassium: 3.9 mmol/L (ref 3.5–5.2)
SODIUM: 143 mmol/L (ref 134–144)

## 2015-10-30 LAB — IRON AND TIBC
IRON SATURATION: 10 % — AB (ref 15–55)
Iron: 33 ug/dL (ref 27–139)
TIBC: 320 ug/dL (ref 250–450)
UIBC: 287 ug/dL (ref 118–369)

## 2015-10-31 ENCOUNTER — Telehealth: Payer: Self-pay | Admitting: Physician Assistant

## 2015-10-31 DIAGNOSIS — K31811 Angiodysplasia of stomach and duodenum with bleeding: Secondary | ICD-10-CM

## 2015-10-31 DIAGNOSIS — R040 Epistaxis: Secondary | ICD-10-CM

## 2015-10-31 NOTE — Telephone Encounter (Signed)
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 Pt saw Dr Tami Ribas who made this diagnosis.See his note in chart

## 2015-10-31 NOTE — Telephone Encounter (Signed)
Referral placed.

## 2015-11-07 ENCOUNTER — Encounter: Payer: Medicare Other | Attending: Physician Assistant | Admitting: Dietician

## 2015-11-07 DIAGNOSIS — Z713 Dietary counseling and surveillance: Secondary | ICD-10-CM | POA: Diagnosis not present

## 2015-11-07 DIAGNOSIS — E119 Type 2 diabetes mellitus without complications: Secondary | ICD-10-CM | POA: Insufficient documentation

## 2015-11-07 DIAGNOSIS — K219 Gastro-esophageal reflux disease without esophagitis: Secondary | ICD-10-CM | POA: Diagnosis not present

## 2015-11-07 DIAGNOSIS — Q2739 Arteriovenous malformation, other site: Secondary | ICD-10-CM | POA: Insufficient documentation

## 2015-11-07 DIAGNOSIS — G4730 Sleep apnea, unspecified: Secondary | ICD-10-CM | POA: Diagnosis not present

## 2015-11-07 NOTE — Progress Notes (Signed)
Medical Nutrition Therapy: Visit start time: 1500  end time: 1530  Assessment:  Diagnosis: obesity, gastric AVM Medical history changes: sinus/ nasal AVM  Psychosocial issues/ stress concerns: none  Current weight: 249.9lbs  Height: 5'1" Medications, supplement changes: no changes  Progress and evaluation: Weight loss of about 7lbs since 09/26/15. Patient report she has decreased chips and sweet snacks, overall making healthier food choices. She reports developing a nasal bleed, which she states is due to AVM. Her hemoglobin has tested low, and she has begun an iron supplement.  Physical activity: started water aerobics 3 times a week; walking on treadmill 2 or more times a week.   Dietary Intake:  Usual eating pattern includes 3 meals and ? snacks per day. Dining out frequency: not assessed today.  Breakfast: small bagel with greek cream cheese; egg whites, [place] bacon, fruit. 1c coffee daily with sugar sub. Or raw sugar Snack: sometimes sugar free jello Lunch: sandwich on whole grain bread or lettuce. Trying not to eat bread very often  Snack:  Supper: chicken [place], ground [place], vegetables fresh. Loves tomatoes; homemade soup with low sodium broth Snack:  Beverages: water, sugar free gatorade, starting to drink small cup of coffee with breakfast.  Nutrition Care Education: Topics covered: weight management Weight control: reviewed progress with diet changes and weight loss. Encouraged small portions of healthy carb choices with meals. Discussed other food options, and gradually resuming greater variety of foods as GI system heals. Briefly discussed dealing with potential weight loss plateaus.  other:  Discussed food sources of iron  Nutritional Diagnosis:  [place]-3.3 Overweight/obesity As related to history of excess calories and inactivity.  As evidenced by patient report. NI-5.1 Increased nutrient needs (specify): iron As related to recurring AVM bleeds.  As evidenced by patient  report.  Intervention: Discussion as noted above.   Patient is making good progress with weight loss efforts. She will continue with current eating pattern for now.    Scheduled one more follow-up in 2 months.       Learner/ who was taught:  . Patient   Level of understanding: Marland Kitchen Verbalizes/ demonstrates competency  Demonstrated degree of understanding via:   Teach back Learning barriers: . None  Willingness to learn/ readiness for change: . Eager, change in progress  Monitoring and Evaluation:  Dietary intake, exercise, and body weight      follow up: 01/09/16

## 2015-11-07 NOTE — Patient Instructions (Signed)
Continue with current healthy eating habits and regular exercise.

## 2015-11-15 DIAGNOSIS — Z23 Encounter for immunization: Secondary | ICD-10-CM | POA: Diagnosis not present

## 2015-11-18 DIAGNOSIS — Z8249 Family history of ischemic heart disease and other diseases of the circulatory system: Secondary | ICD-10-CM | POA: Diagnosis not present

## 2015-11-18 DIAGNOSIS — Q2733 Arteriovenous malformation of digestive system vessel: Secondary | ICD-10-CM | POA: Diagnosis not present

## 2015-11-18 DIAGNOSIS — R040 Epistaxis: Secondary | ICD-10-CM | POA: Diagnosis not present

## 2015-11-18 DIAGNOSIS — Z8719 Personal history of other diseases of the digestive system: Secondary | ICD-10-CM | POA: Diagnosis not present

## 2015-11-18 DIAGNOSIS — D6851 Activated protein C resistance: Secondary | ICD-10-CM | POA: Diagnosis not present

## 2015-11-18 DIAGNOSIS — K31819 Angiodysplasia of stomach and duodenum without bleeding: Secondary | ICD-10-CM | POA: Diagnosis not present

## 2015-11-18 DIAGNOSIS — D500 Iron deficiency anemia secondary to blood loss (chronic): Secondary | ICD-10-CM | POA: Diagnosis not present

## 2015-11-18 DIAGNOSIS — F329 Major depressive disorder, single episode, unspecified: Secondary | ICD-10-CM | POA: Diagnosis not present

## 2015-11-18 DIAGNOSIS — Z79899 Other long term (current) drug therapy: Secondary | ICD-10-CM | POA: Diagnosis not present

## 2015-11-18 DIAGNOSIS — K219 Gastro-esophageal reflux disease without esophagitis: Secondary | ICD-10-CM | POA: Diagnosis not present

## 2015-11-18 DIAGNOSIS — H3530 Unspecified macular degeneration: Secondary | ICD-10-CM | POA: Diagnosis not present

## 2015-11-18 DIAGNOSIS — I798 Other disorders of arteries, arterioles and capillaries in diseases classified elsewhere: Secondary | ICD-10-CM | POA: Diagnosis not present

## 2015-11-18 DIAGNOSIS — G4733 Obstructive sleep apnea (adult) (pediatric): Secondary | ICD-10-CM | POA: Diagnosis not present

## 2015-11-18 DIAGNOSIS — I780 Hereditary hemorrhagic telangiectasia: Secondary | ICD-10-CM | POA: Diagnosis not present

## 2015-11-20 DIAGNOSIS — N3946 Mixed incontinence: Secondary | ICD-10-CM | POA: Diagnosis not present

## 2015-11-21 DIAGNOSIS — L298 Other pruritus: Secondary | ICD-10-CM | POA: Diagnosis not present

## 2015-12-06 DIAGNOSIS — D500 Iron deficiency anemia secondary to blood loss (chronic): Secondary | ICD-10-CM | POA: Diagnosis not present

## 2015-12-09 DIAGNOSIS — N3946 Mixed incontinence: Secondary | ICD-10-CM | POA: Diagnosis not present

## 2015-12-12 DIAGNOSIS — D500 Iron deficiency anemia secondary to blood loss (chronic): Secondary | ICD-10-CM | POA: Diagnosis not present

## 2015-12-15 IMAGING — CR DG ANKLE COMPLETE 3+V*L*
4 series · 4 of 4 positions shown · non-contrast
Comparison: None

CLINICAL DATA: LEFT ankle pain, no additional history provided

EXAM:
LEFT ANKLE COMPLETE - 3+ VIEW

[AP]
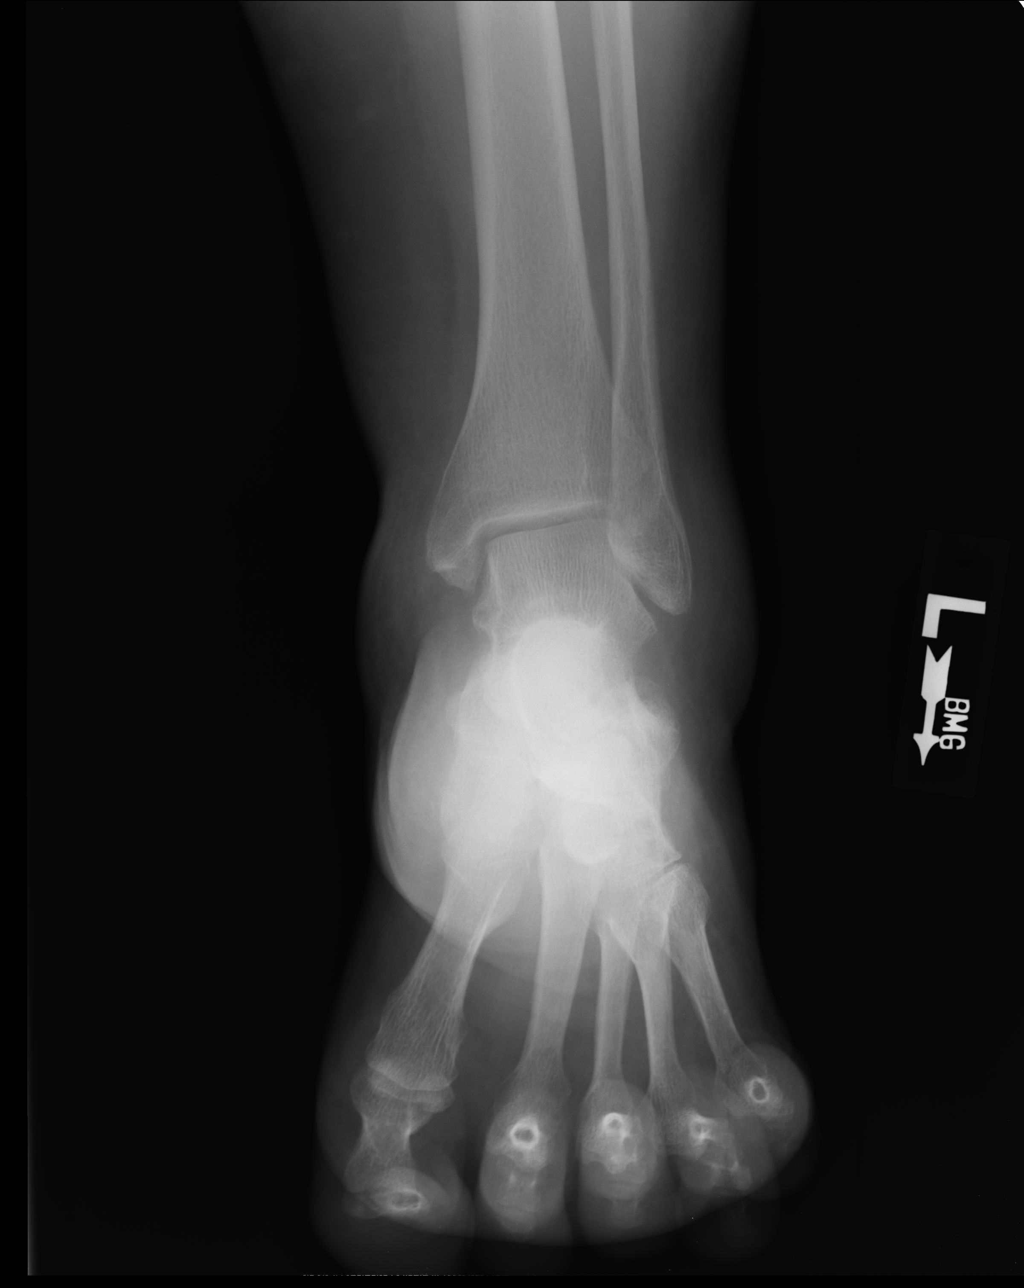

[ap obl int rot]
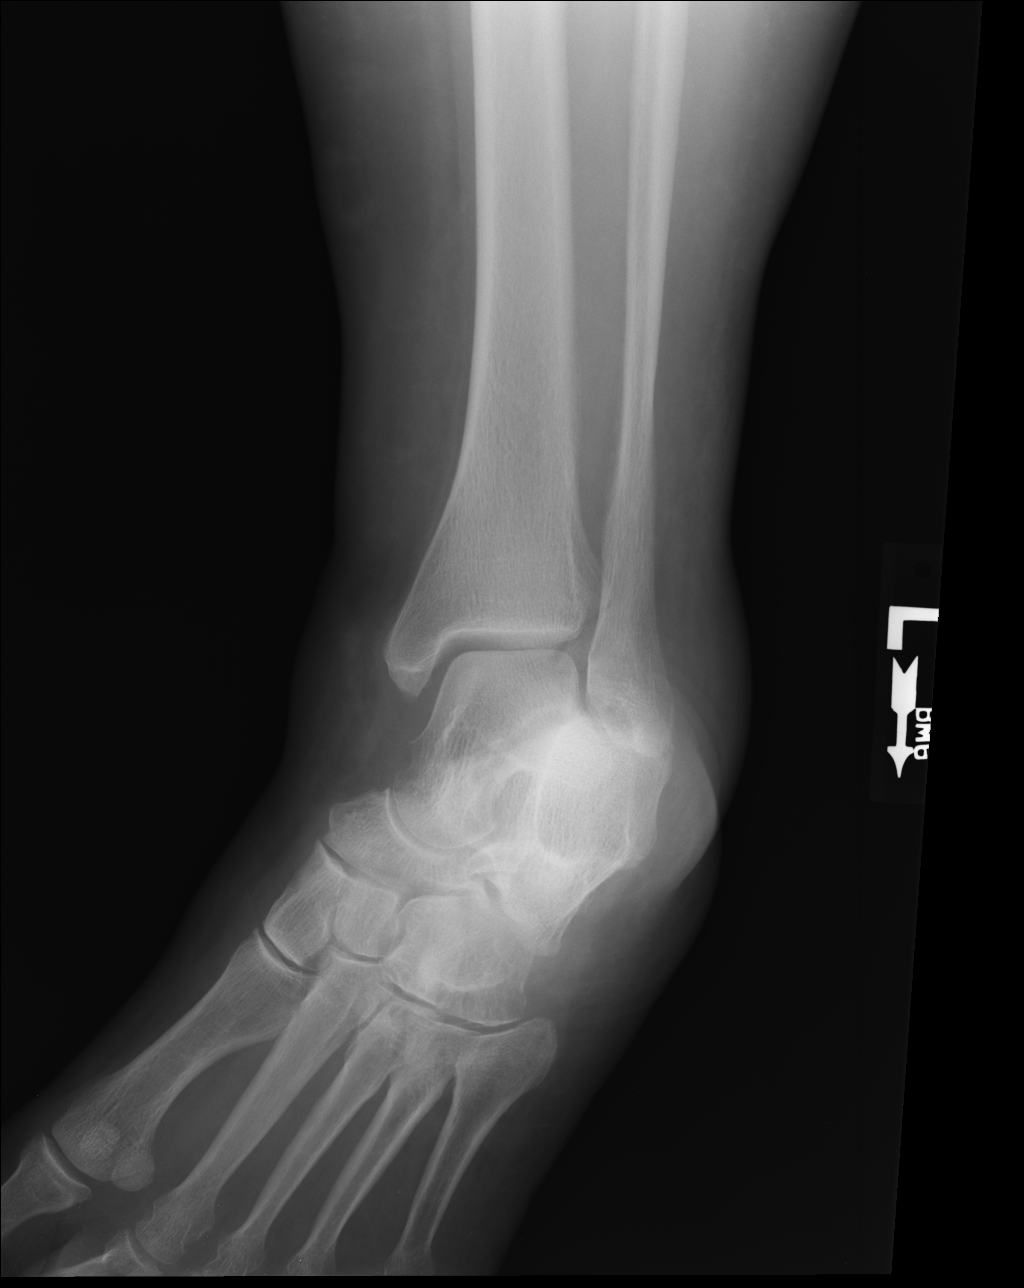

[medial obl]
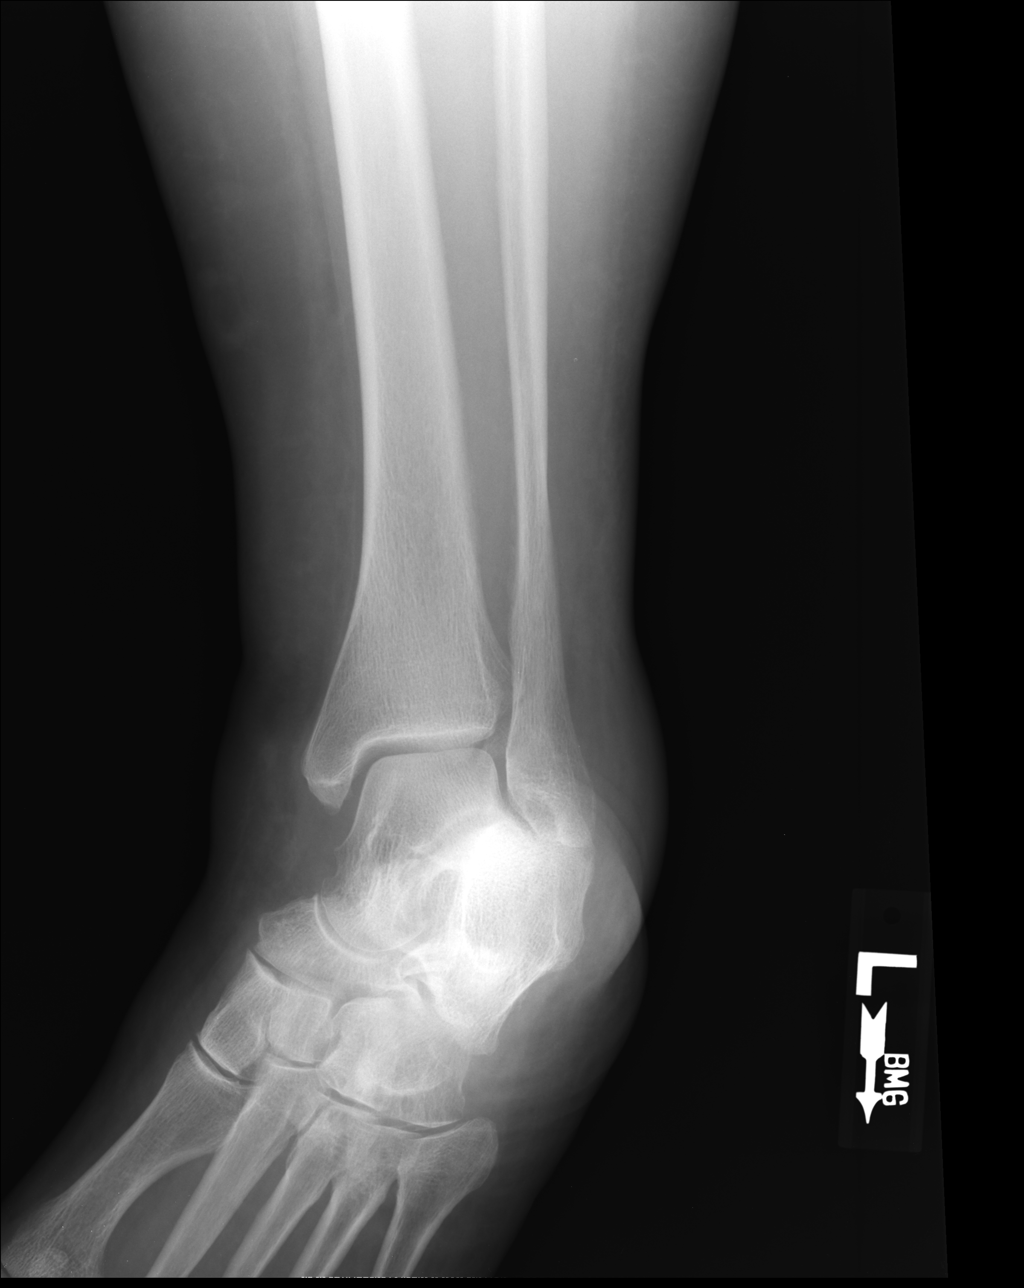

[lateral]
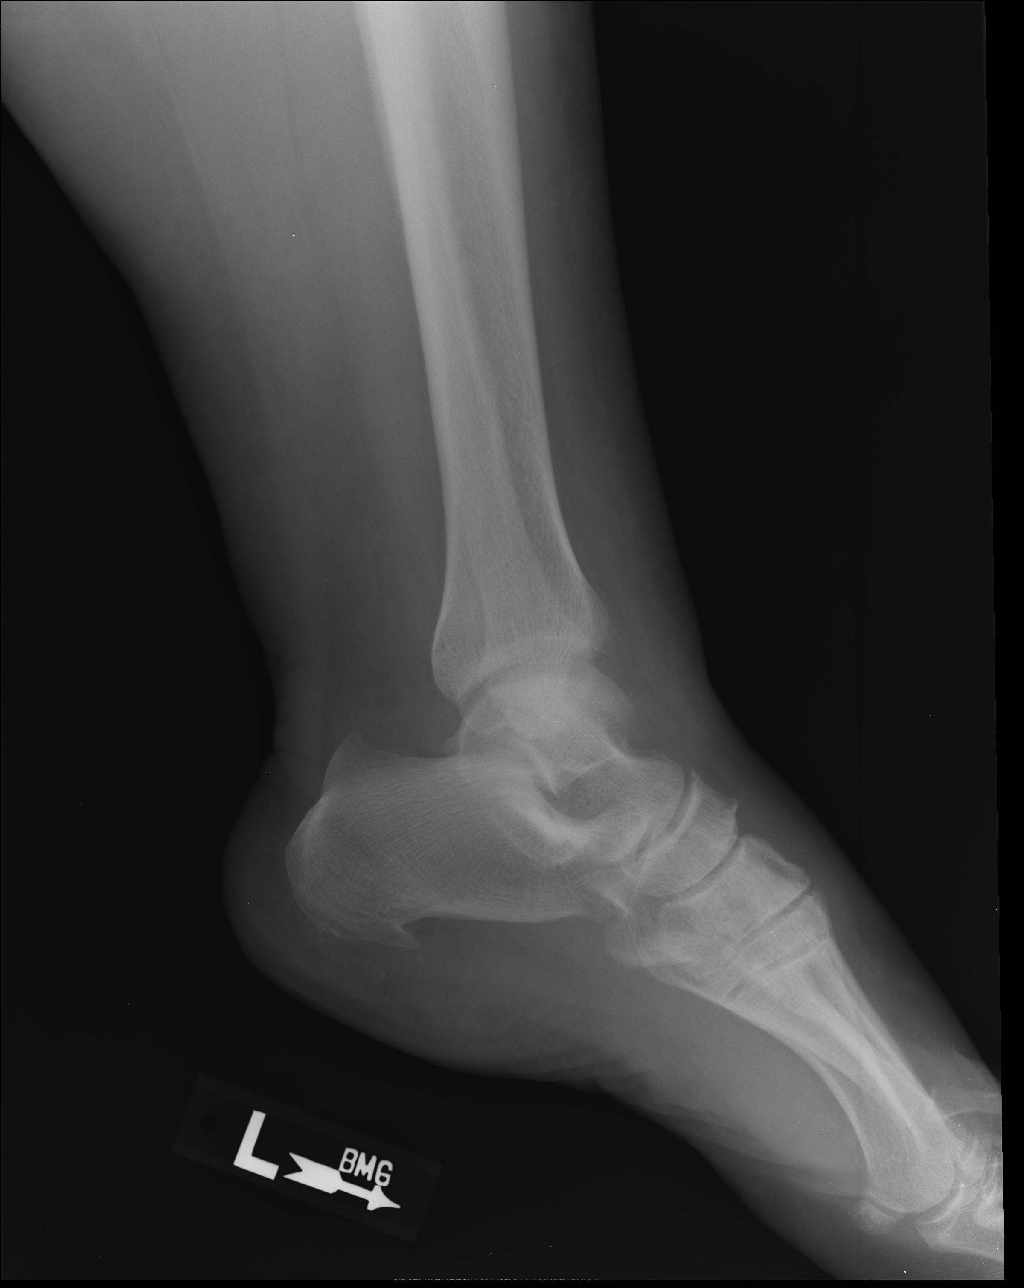

[4 of 4 positions shown; findings below may reference images not displayed]

FINDINGS: Diffuse soft tissue swelling.

Bones mildly demineralized.

Joint spaces preserved.

Moderate-sized plantar calcaneal spur.

No acute fracture, dislocation, or bone destruction.
IMPRESSION: No acute osseous abnormalities.

Plantar calcaneal spur.

## 2015-12-16 DIAGNOSIS — N3946 Mixed incontinence: Secondary | ICD-10-CM | POA: Diagnosis not present

## 2015-12-30 DIAGNOSIS — I780 Hereditary hemorrhagic telangiectasia: Secondary | ICD-10-CM | POA: Diagnosis not present

## 2015-12-30 DIAGNOSIS — N3946 Mixed incontinence: Secondary | ICD-10-CM | POA: Diagnosis not present

## 2015-12-30 DIAGNOSIS — I798 Other disorders of arteries, arterioles and capillaries in diseases classified elsewhere: Secondary | ICD-10-CM | POA: Diagnosis not present

## 2015-12-30 DIAGNOSIS — Z0189 Encounter for other specified special examinations: Secondary | ICD-10-CM | POA: Diagnosis not present

## 2015-12-31 DIAGNOSIS — R9082 White matter disease, unspecified: Secondary | ICD-10-CM | POA: Diagnosis not present

## 2015-12-31 DIAGNOSIS — I780 Hereditary hemorrhagic telangiectasia: Secondary | ICD-10-CM | POA: Diagnosis not present

## 2016-01-06 ENCOUNTER — Encounter: Payer: Self-pay | Admitting: Physician Assistant

## 2016-01-06 ENCOUNTER — Ambulatory Visit (INDEPENDENT_AMBULATORY_CARE_PROVIDER_SITE_OTHER): Payer: Medicare Other | Admitting: Physician Assistant

## 2016-01-06 VITALS — BP 160/70 | HR 104 | Temp 98.7°F | Resp 18 | Wt 249.4 lb

## 2016-01-06 DIAGNOSIS — J069 Acute upper respiratory infection, unspecified: Secondary | ICD-10-CM

## 2016-01-06 DIAGNOSIS — R059 Cough, unspecified: Secondary | ICD-10-CM

## 2016-01-06 DIAGNOSIS — R05 Cough: Secondary | ICD-10-CM

## 2016-01-06 LAB — POCT INFLUENZA A/B
Influenza A, POC: NEGATIVE
Influenza B, POC: NEGATIVE

## 2016-01-06 MED ORDER — BENZONATATE 100 MG PO CAPS: 100.0000 mg | ORAL_CAPSULE | Freq: Three times a day (TID) | ORAL | 0 refills | Status: AC | PRN

## 2016-01-06 NOTE — Patient Instructions (Signed)
Upper Respiratory Infection, Adult Most upper respiratory infections (URIs) are a viral infection of the air passages leading to the lungs. A URI affects the nose, throat, and upper air passages. The most common type of URI is nasopharyngitis and is typically referred to as "the common cold." URIs run their course and usually go away on their own. Most of the time, a URI does not require medical attention, but sometimes a bacterial infection in the upper airways can follow a viral infection. This is called a secondary infection. Sinus and middle ear infections are common types of secondary upper respiratory infections. Bacterial pneumonia can also complicate a URI. A URI can worsen asthma and chronic obstructive pulmonary disease (COPD). Sometimes, these complications can require emergency medical care and may be life threatening.  CAUSES Almost all URIs are caused by viruses. A virus is a type of germ and can spread from one person to another.  RISKS FACTORS You may be at risk for a URI if:   You smoke.   You have chronic heart or lung disease.  You have a weakened defense (immune) system.   You are very young or very old.   You have nasal allergies or asthma.  You work in crowded or poorly ventilated areas.  You work in health care facilities or schools. SIGNS AND SYMPTOMS  Symptoms typically develop 2-3 days after you come in contact with a cold virus. Most viral URIs last 7-10 days. However, viral URIs from the influenza virus (flu virus) can last 14-18 days and are typically more severe. Symptoms may include:   Runny or stuffy (congested) nose.   Sneezing.   Cough.   Sore throat.   Headache.   Fatigue.   Fever.   Loss of appetite.   Pain in your forehead, behind your eyes, and over your cheekbones (sinus pain).  Muscle aches.  DIAGNOSIS  Your health care provider may diagnose a URI by:  Physical exam.  Tests to check that your symptoms are not due to  another condition such as:  Strep throat.  Sinusitis.  Pneumonia.  Asthma. TREATMENT  A URI goes away on its own with time. It cannot be cured with medicines, but medicines may be prescribed or recommended to relieve symptoms. Medicines may help:  Reduce your fever.  Reduce your cough.  Relieve nasal congestion. HOME CARE INSTRUCTIONS   Take medicines only as directed by your health care provider.   Gargle warm saltwater or take cough drops to comfort your throat as directed by your health care provider.  Use a warm mist humidifier or inhale steam from a shower to increase air moisture. This may make it easier to breathe.  Drink enough fluid to keep your urine clear or pale yellow.   Eat soups and other clear broths and maintain good nutrition.   Rest as needed.   Return to work when your temperature has returned to normal or as your health care provider advises. You may need to stay home longer to avoid infecting others. You can also use a face mask and careful hand washing to prevent spread of the virus.  Increase the usage of your inhaler if you have asthma.   Do not use any tobacco products, including cigarettes, chewing tobacco, or electronic cigarettes. If you need help quitting, ask your health care provider. PREVENTION  The best way to protect yourself from getting a cold is to practice good hygiene.   Avoid oral or hand contact with people with cold   symptoms.   Wash your hands often if contact occurs.  There is no clear evidence that vitamin C, vitamin E, echinacea, or exercise reduces the chance of developing a cold. However, it is always recommended to get plenty of rest, exercise, and practice good nutrition.  SEEK MEDICAL CARE IF:   You are getting worse rather than better.   Your symptoms are not controlled by medicine.   You have chills.  You have worsening shortness of breath.  You have brown or red mucus.  You have yellow or brown nasal  discharge.  You have pain in your face, especially when you bend forward.  You have a fever.  You have swollen neck glands.  You have pain while swallowing.  You have white areas in the back of your throat. SEEK IMMEDIATE MEDICAL CARE IF:   You have severe or persistent:  Headache.  Ear pain.  Sinus pain.  Chest pain.  You have chronic lung disease and any of the following:  Wheezing.  Prolonged cough.  Coughing up blood.  A change in your usual mucus.  You have a stiff neck.  You have changes in your:  Vision.  Hearing.  Thinking.  Mood. MAKE SURE YOU:   Understand these instructions.  Will watch your condition.  Will get help right away if you are not doing well or get worse.   This information is not intended to replace advice given to you by your health care provider. Make sure you discuss any questions you have with your health care provider.   Document Released: 08/05/2000 Document Revised: 06/26/2014 Document Reviewed: 05/17/2013 Elsevier Interactive Patient Education 2016 Elsevier Inc[employer]  

## 2016-01-06 NOTE — Progress Notes (Deleted)
Upper Respiratory Infection: Patient complains of symptoms of a URI. Symptoms include congestion, cough and mild nausea, PND. Onset of symptoms was 1 day ago, stable since that time. She also c/o {uri sx:15453} for the past {numbers 1-16:15321} {time; units w/plural:11} .  She {hydration history:15378}. Evaluation to date: {uri eval:14242::"none"}. Treatment to date: {uri tx:14268}.

## 2016-01-06 NOTE — Progress Notes (Signed)
Patient: Faith Thompson Female    DOB: 1945-11-30   70 y.o.   MRN: [id number] Visit Date: 01/06/2016  Today's Provider: Trinna Post, PA-C   Chief Complaint  Patient presents with  . URI   Subjective:    URI   This is a new problem. The current episode started yesterday. The problem has been gradually worsening. There has been no fever. Associated symptoms include congestion, coughing, headaches, rhinorrhea, sinus pain and wheezing. Pertinent negatives include no chest pain, ear pain, plugged ear sensation or sore throat. She has tried nothing for the symptoms.   Patient is a 70 y/o female with history of sleep apnea on CPAP coming in with one day of cough and congestion symptoms. She denies myalgias, diarrhea. Endorses some mild nausea. She has tried nothing for the symptoms. She denies shortness of breath, has some mild wheezes. Non-smoker. She is coming in today because she says she gets sick very easily and is concerned this will turn into pneumonia.     Allergies  Allergen Reactions  . Nsaids Other (See Comments)    GI Bleed, AVMs  . Codeine Itching  . Adhesive [Tape] Rash    Any tape - paper, bandaid     Current Outpatient Prescriptions:  .  calcium carbonate (OS-CAL) 600 MG TABS, Take 600 mg by mouth 2 (two) times daily with a meal., Disp: , Rfl:  .  cetirizine (ZYRTEC) 10 MG tablet, Take 10 mg by mouth daily., Disp: , Rfl:  .  ferrous sulfate 325 (65 FE) MG tablet, Take 325 mg by mouth daily with breakfast., Disp: , Rfl:  .  Lutein 6 MG CAPS, Take by mouth daily., Disp: , Rfl:  .  mirabegron ER (MYRBETRIQ) 50 MG TB24 tablet, Take 50 mg by mouth., Disp: , Rfl:  .  Multiple Vitamins-Minerals (I-VITE) TABS, Take 2 tablets by mouth 2 (two) times daily., Disp: , Rfl:  .  omeprazole (PRILOSEC) 20 MG capsule, Take 2 capsules (40 mg total) by mouth 2 (two) times daily before a meal., Disp: 90 capsule, Rfl: 3 .  sertraline (ZOLOFT) 50 MG tablet, TAKE ONE TABLET BY MOUTH  EVERY DAY, Disp: 90 tablet, Rfl: 3 .  gabapentin (NEURONTIN) 300 MG capsule, Take 300 mg by mouth at bedtime. , Disp: , Rfl:  .  hydrocortisone 2.5 % ointment, Apply topically 2 (two) times daily. (Patient not taking: Reported on 01/06/2016), Disp: 30 g, Rfl: 0 .  oxybutynin (DITROPAN-XL) 10 MG 24 hr tablet, Take 1 tablet (10 mg total) by mouth at bedtime. (Patient not taking: Reported on 01/06/2016), Disp: 90 tablet, Rfl: 3  Review of Systems  Constitutional: Negative for chills and fever.  HENT: Positive for congestion, postnasal drip, rhinorrhea and sinus pain. Negative for ear pain and sore throat.   Respiratory: Positive for cough and wheezing. Negative for chest tightness.   Cardiovascular: Negative for chest pain, palpitations and leg swelling.  Neurological: Positive for light-headedness and headaches.    Social History  Substance Use Topics  . Smoking status: Never Smoker  . Smokeless tobacco: Never Used  . Alcohol use 0.0 oz/week     Comment: occasional very rarely social events   Objective:   BP (!) 160/70 (BP Location: Right Arm, Patient Position: Sitting, Cuff Size: Normal)   Pulse (!) 104   Temp 98.7 F (37.1 C) (Oral)   Resp 18   Wt 249 lb 6.4 oz (113.1 kg)   SpO2 97%  BMI 45.62 kg/m   Physical Exam  Constitutional: She appears well-developed and well-nourished.  HENT:  Right Ear: External ear normal.  Left Ear: External ear normal.  Mouth/Throat: Oropharynx is clear and moist. No oropharyngeal exudate.  Eyes: Right eye exhibits no discharge. Left eye exhibits no discharge.  Neck: Neck supple.  Cardiovascular: Normal rate and regular rhythm.   Pulmonary/Chest: Effort normal. No respiratory distress. She has wheezes in the right upper field and the left upper field. She has no rales.  There are some occasional wheezes in the bilateral upper lung fields.   Lymphadenopathy:    She has no cervical adenopathy.  Skin: Skin is warm and dry.  Psychiatric: She has  a normal mood and affect. Her behavior is normal.        Assessment & Plan:      Problem List Items Addressed This Visit    None    Visit Diagnoses    Cough    -  Primary   Relevant Medications   benzonatate (TESSALON PERLES) 100 MG capsule   Other Relevant Orders   POCT Influenza A/B (Completed)   Upper respiratory tract infection, unspecified type       Relevant Medications   benzonatate (TESSALON PERLES) 100 MG capsule   Other Relevant Orders   POCT Influenza A/B (Completed)     Patient is 70 year old presenting with symptoms of upper respiratory infection. Rapid flu in office today was negative. Counseled patient that it is too early for antibiotics. Some mild wheezing in upper lung fields, offered patient albuterol inhaler but patient declines this as she doesn't think she needs it right now. Counseled patient on return precautions. If she does not improve in 7 days or is rapidly worsening, will likely get CXR and prescribe doxycycline.    Patient Instructions  Upper Respiratory Infection, Adult Most upper respiratory infections (URIs) are a viral infection of the air passages leading to the lungs. A URI affects the nose, throat, and upper air passages. The most common type of URI is nasopharyngitis and is typically referred to as "the common cold." URIs run their course and usually go away on their own. Most of the time, a URI does not require medical attention, but sometimes a bacterial infection in the upper airways can follow a viral infection. This is called a secondary infection. Sinus and middle ear infections are common types of secondary upper respiratory infections. Bacterial pneumonia can also complicate a URI. A URI can worsen asthma and chronic obstructive pulmonary disease (COPD). Sometimes, these complications can require emergency medical care and may be life threatening.  CAUSES Almost all URIs are caused by viruses. A virus is a type of germ and can spread from  one person to another.  RISKS FACTORS You may be at risk for a URI if:   You smoke.   You have chronic heart or lung disease.  You have a weakened defense (immune) system.   You are very young or very old.   You have nasal allergies or asthma.  You work in crowded or poorly ventilated areas.  You work in health care facilities or schools. SIGNS AND SYMPTOMS  Symptoms typically develop 2-3 days after you come in contact with a cold virus. Most viral URIs last 7-10 days. However, viral URIs from the influenza virus (flu virus) can last 14-18 days and are typically more severe. Symptoms may include:   Runny or stuffy (congested) nose.   Sneezing.   Cough.  Sore throat.   Headache.   Fatigue.   Fever.   Loss of appetite.   Pain in your forehead, behind your eyes, and over your cheekbones (sinus pain).  Muscle aches.  DIAGNOSIS  Your health care provider may diagnose a URI by:  Physical exam.  Tests to check that your symptoms are not due to another condition such as:  Strep throat.  Sinusitis.  Pneumonia.  Asthma. TREATMENT  A URI goes away on its own with time. It cannot be cured with medicines, but medicines may be prescribed or recommended to relieve symptoms. Medicines may help:  Reduce your fever.  Reduce your cough.  Relieve nasal congestion. HOME CARE INSTRUCTIONS   Take medicines only as directed by your health care provider.   Gargle warm saltwater or take cough drops to comfort your throat as directed by your health care provider.  Use a warm mist humidifier or inhale steam from a shower to increase air moisture. This may make it easier to breathe.  Drink enough fluid to keep your urine clear or pale yellow.   Eat soups and other clear broths and maintain good nutrition.   Rest as needed.   Return to work when your temperature has returned to normal or as your health care provider advises. You may need to stay home  longer to avoid infecting others. You can also use a face mask and careful hand washing to prevent spread of the virus.  Increase the usage of your inhaler if you have asthma.   Do not use any tobacco products, including cigarettes, chewing tobacco, or electronic cigarettes. If you need help quitting, ask your health care provider. PREVENTION  The best way to protect yourself from getting a cold is to practice good hygiene.   Avoid oral or hand contact with people with cold symptoms.   Wash your hands often if contact occurs.  There is no clear evidence that vitamin C, vitamin E, echinacea, or exercise reduces the chance of developing a cold. However, it is always recommended to get plenty of rest, exercise, and practice good nutrition.  SEEK MEDICAL CARE IF:   You are getting worse rather than better.   Your symptoms are not controlled by medicine.   You have chills.  You have worsening shortness of breath.  You have brown or red mucus.  You have yellow or brown nasal discharge.  You have pain in your face, especially when you bend forward.  You have a fever.  You have swollen neck glands.  You have pain while swallowing.  You have white areas in the back of your throat. SEEK IMMEDIATE MEDICAL CARE IF:   You have severe or persistent:  Headache.  Ear pain.  Sinus pain.  Chest pain.  You have chronic lung disease and any of the following:  Wheezing.  Prolonged cough.  Coughing up blood.  A change in your usual mucus.  You have a stiff neck.  You have changes in your:  Vision.  Hearing.  Thinking.  Mood. MAKE SURE YOU:   Understand these instructions.  Will watch your condition.  Will get help right away if you are not doing well or get worse.   This information is not intended to replace advice given to you by your health care provider. Make sure you discuss any questions you have with your health care provider.   Document Released:  08/05/2000 Document Revised: 06/26/2014 Document Reviewed: 05/17/2013 Elsevier Interactive Patient Education [employer].  The entirety of the information documented in the History of Present Illness, Review of Systems and Physical Exam were personally obtained by me. Portions of this information were initially documented by Joselin and reviewed by me for thoroughness and accuracy.     Return if symptoms worsen or fail to improve.        Trinna Post, PA-C  [facility]

## 2016-01-09 ENCOUNTER — Encounter: Payer: Self-pay | Admitting: Dietician

## 2016-01-09 ENCOUNTER — Telehealth: Payer: Self-pay | Admitting: Physician Assistant

## 2016-01-09 ENCOUNTER — Encounter: Payer: Medicare Other | Attending: Physician Assistant | Admitting: Dietician

## 2016-01-09 DIAGNOSIS — Q2739 Arteriovenous malformation, other site: Secondary | ICD-10-CM | POA: Diagnosis not present

## 2016-01-09 DIAGNOSIS — E119 Type 2 diabetes mellitus without complications: Secondary | ICD-10-CM | POA: Diagnosis not present

## 2016-01-09 DIAGNOSIS — K219 Gastro-esophageal reflux disease without esophagitis: Secondary | ICD-10-CM | POA: Diagnosis not present

## 2016-01-09 DIAGNOSIS — Z713 Dietary counseling and surveillance: Secondary | ICD-10-CM | POA: Insufficient documentation

## 2016-01-09 DIAGNOSIS — G4730 Sleep apnea, unspecified: Secondary | ICD-10-CM | POA: Diagnosis not present

## 2016-01-09 NOTE — Progress Notes (Signed)
Medical Nutrition Therapy: Visit start time: 1500  end time: 1530  Assessment:  Diagnosis: obesity Medical history changes: recent diagnosis of Hereditary Hemorrhagic Telangiectasia; patient with current URI Psychosocial issues/ stress concerns: none  Current weight: 242.4lbs  Height: 5'1" Medications, supplement changes: updated list in chart  Progress and evaluation: Weight loss of 7.5lbs since previous visit on 11/07/15. She reports less attention to diet in the past several days since developing URI, but has been working on eating smaller portions, fewer snacks.  She plans to eat out for Thanksgiving dinner to avoid dealing with leftovers and overeating later.  Physical activity: water aerobics 3 times a week and includes extra water walking; plans to resume outdoor walking when feeling better.   Dietary Intake:  Usual eating pattern includes 3 meals and 0-1 snacks per day. Dining out frequency: not assessed today-- limits starches when eating out.  Breakfast: eggs, salsa, [place] bacon Snack: none Lunch: sandwich on lettuce Snack: none or fruit (apple, banana) Supper: chicken, ground [place], vegetables, soups Snack: none Beverages: water, lemonade, unsweetened tea  Nutrition Care Education: Topics covered: weight management    Weight control: reviewed progress since previous visit; discussed principles of portion control; weight loss patterns  Nutritional Diagnosis:  [place]-3.3 Overweight/obesity As related to history of excess calories and inactivity.  As evidenced by patient report.  Intervention: Discussion as noted above.   Goal is for patient to continue with current eating and activity patterns and plans.    Commended patient for progress made and efforts to deal with difficult situations.   Learner/ who was taught:  . Patient   Level of understanding: Marland Kitchen Verbalizes/ demonstrates competency  Demonstrated degree of understanding via:   Teach back Learning  barriers: . None  Willingness to learn/ readiness for change: . Eager, change in progress  Monitoring and Evaluation:  Dietary intake, exercise, and body weight      follow up: 03/10/16

## 2016-01-09 NOTE — Telephone Encounter (Signed)
Pt states she was seen on Monday for cough and congestion.  Pt state she is not felling any better.  Pt is requesting an x-ray and a Rx to help with this.  [facility].  [id number]

## 2016-01-09 NOTE — Patient Instructions (Signed)
Continue with current eating pattern and regular exercise.  Great job working on portion control and planning ahead for holiday meals!

## 2016-01-10 ENCOUNTER — Other Ambulatory Visit: Payer: Self-pay | Admitting: Physician Assistant

## 2016-01-10 DIAGNOSIS — R059 Cough, unspecified: Secondary | ICD-10-CM

## 2016-01-10 DIAGNOSIS — R05 Cough: Secondary | ICD-10-CM

## 2016-01-10 MED ORDER — AZITHROMYCIN 250 MG PO TABS: ORAL_TABLET | ORAL | 0 refills | Status: DC

## 2016-01-10 NOTE — Telephone Encounter (Signed)
Pt  Is request a call back.  [phone]

## 2016-01-10 NOTE — Progress Notes (Signed)
Sent in Z[facility] to [facility] on [address]. If patient not improving, will get Xray.

## 2016-01-10 NOTE — Progress Notes (Signed)
Patient advised.KW 

## 2016-01-10 NOTE — Telephone Encounter (Signed)
Patient advised antibiotic called into pharmacy.KW

## 2016-01-13 ENCOUNTER — Ambulatory Visit
Admission: RE | Admit: 2016-01-13 | Discharge: 2016-01-13 | Disposition: A | Discharge status: Home / Self Care | Payer: Medicare Other | Source: Ambulatory Visit | Attending: Family Medicine | Admitting: Family Medicine

## 2016-01-13 ENCOUNTER — Ambulatory Visit (INDEPENDENT_AMBULATORY_CARE_PROVIDER_SITE_OTHER): Payer: Medicare Other | Admitting: Family Medicine

## 2016-01-13 ENCOUNTER — Encounter: Payer: Self-pay | Admitting: Family Medicine

## 2016-01-13 VITALS — BP 122/74 | HR 90 | Temp 98.4°F | Resp 20 | Wt 246.0 lb

## 2016-01-13 DIAGNOSIS — J069 Acute upper respiratory infection, unspecified: Secondary | ICD-10-CM

## 2016-01-13 DIAGNOSIS — B9789 Other viral agents as the cause of diseases classified elsewhere: Secondary | ICD-10-CM

## 2016-01-13 DIAGNOSIS — R918 Other nonspecific abnormal finding of lung field: Secondary | ICD-10-CM | POA: Diagnosis not present

## 2016-01-13 DIAGNOSIS — R05 Cough: Secondary | ICD-10-CM | POA: Diagnosis not present

## 2016-01-13 MED ORDER — HYDROCODONE-HOMATROPINE 5-1.5 MG/5ML PO SYRP: ORAL_SOLUTION | ORAL | 0 refills | Status: DC

## 2016-01-13 NOTE — Progress Notes (Signed)
Subjective:     Patient ID: Faith Thompson, female   DOB: September 18, 1945, 70 y.o.   MRN: [id number]  HPI  Chief Complaint  Patient presents with  . URI     Symptom onset 01/05/16. Pt c/o chills, wheezing, dry cough, nasal congestion, some headache. Denies sinus pain/pressure. Has not checked temperature. Denies otalgia, sore throat. Pt saw Adriana on 01/06/2016, who started pt on Tessalon Perles. Pt called back, and Adriana started pt on Z Pak. Pt has one more day left of abx.   Reports persistent clear sinus drainage and cough occasionally productive of clear sputum. States she had pneumonia 3 years ago and is fearful that she has the same.   Review of Systems     Objective:   Physical Exam  Constitutional: She appears well-developed and well-nourished. No distress.  Ears: T.M's intact without inflammation Throat: tonsils absent Neck: no cervical adenopathy Lungs: clear     Assessment:    1. Viral upper respiratory tract infection - HYDROcodone-homatropine (HYCODAN) 5-1.5 MG/5ML syrup; 5 ml 4-6 hours as needed for cough  Dispense: 240 mL; Refill: 0 - DG Chest 2 View; Future    Plan:    Discussed use of Mucinex D. Further f/u pending x-ray report.

## 2016-01-13 NOTE — Patient Instructions (Addendum)
Discussed use of Mucinex D. 

## 2016-01-17 ENCOUNTER — Telehealth: Payer: Self-pay | Admitting: Physician Assistant

## 2016-01-17 ENCOUNTER — Telehealth: Payer: Self-pay | Admitting: Family Medicine

## 2016-01-17 NOTE — Telephone Encounter (Signed)
Left message to call regarding her progress with URI

## 2016-01-17 NOTE — Telephone Encounter (Signed)
Pt returned Bob's call.  Thanks, Con Memos

## 2016-01-17 NOTE — Telephone Encounter (Signed)
Let message to call back regarding progress with her symptoms.

## 2016-01-17 NOTE — Telephone Encounter (Signed)
Improved. Will call for f/u CXR in 3 weeks.Marland Kitchen

## 2016-01-20 DIAGNOSIS — Z862 Personal history of diseases of the blood and blood-forming organs and certain disorders involving the immune mechanism: Secondary | ICD-10-CM | POA: Diagnosis not present

## 2016-01-20 DIAGNOSIS — D6851 Activated protein C resistance: Secondary | ICD-10-CM | POA: Diagnosis not present

## 2016-01-20 DIAGNOSIS — K5520 Angiodysplasia of colon without hemorrhage: Secondary | ICD-10-CM | POA: Diagnosis not present

## 2016-01-20 DIAGNOSIS — R040 Epistaxis: Secondary | ICD-10-CM | POA: Diagnosis not present

## 2016-01-20 DIAGNOSIS — I780 Hereditary hemorrhagic telangiectasia: Secondary | ICD-10-CM | POA: Diagnosis not present

## 2016-01-20 DIAGNOSIS — G4733 Obstructive sleep apnea (adult) (pediatric): Secondary | ICD-10-CM | POA: Diagnosis not present

## 2016-01-20 DIAGNOSIS — D500 Iron deficiency anemia secondary to blood loss (chronic): Secondary | ICD-10-CM | POA: Diagnosis not present

## 2016-01-20 DIAGNOSIS — Z85828 Personal history of other malignant neoplasm of skin: Secondary | ICD-10-CM | POA: Diagnosis not present

## 2016-01-20 DIAGNOSIS — K219 Gastro-esophageal reflux disease without esophagitis: Secondary | ICD-10-CM | POA: Diagnosis not present

## 2016-01-20 DIAGNOSIS — H3530 Unspecified macular degeneration: Secondary | ICD-10-CM | POA: Diagnosis not present

## 2016-01-20 DIAGNOSIS — Z8719 Personal history of other diseases of the digestive system: Secondary | ICD-10-CM | POA: Diagnosis not present

## 2016-01-20 DIAGNOSIS — M1990 Unspecified osteoarthritis, unspecified site: Secondary | ICD-10-CM | POA: Diagnosis not present

## 2016-01-20 DIAGNOSIS — J45909 Unspecified asthma, uncomplicated: Secondary | ICD-10-CM | POA: Diagnosis not present

## 2016-02-10 DIAGNOSIS — N3946 Mixed incontinence: Secondary | ICD-10-CM | POA: Diagnosis not present

## 2016-02-13 ENCOUNTER — Encounter: Payer: Self-pay | Admitting: Physician Assistant

## 2016-02-13 ENCOUNTER — Ambulatory Visit (INDEPENDENT_AMBULATORY_CARE_PROVIDER_SITE_OTHER): Payer: Medicare Other | Admitting: Physician Assistant

## 2016-02-13 VITALS — BP 132/64 | HR 80 | Temp 97.6°F | Resp 16 | Wt 250.0 lb

## 2016-02-13 DIAGNOSIS — I780 Hereditary hemorrhagic telangiectasia: Secondary | ICD-10-CM | POA: Diagnosis not present

## 2016-02-13 DIAGNOSIS — B372 Candidiasis of skin and nail: Secondary | ICD-10-CM

## 2016-02-13 MED ORDER — NYSTATIN-TRIAMCINOLONE 100000-0.1 UNIT/GM-% EX OINT: 1.0000 "application " | TOPICAL_OINTMENT | Freq: Two times a day (BID) | CUTANEOUS | 0 refills | Status: DC

## 2016-02-13 NOTE — Patient Instructions (Signed)
Nystatin; Triamcinolone cream or ointment What is this medicine? NYSTATIN; TRIAMCINOLONE (nye STAT in; trye am SIN oh lone) is a combination of an antifungal medicine and a steroid. It is used to treat certain kinds of fungal or yeast infections of the skin. This medicine may be used for other purposes; ask your health care provider or pharmacist if you have questions. COMMON BRAND NAME(S): Myco-Triacet-II, Mycogen-II, Mycolog II, Mytrex, N.T.A. What should I tell my health care provider before I take this medicine? They need to know if you have any of these conditions: -large areas of burned or damaged skin -skin wasting or thinning -peripheral vascular disease or poor circulation -an unusual or allergic reaction to nystatin, triamcinolone, other corticosteroids, other medicines, foods, dyes, or preservatives -pregnant or trying to get pregnant -breast-feeding How should I use this medicine? This medicine is for external use only. Do not take by mouth. Follow the directions on the prescription label. Wash your hands before and after use. If treating hand or nail infections, wash hands before use only. Apply a thin layer of this medicine to the affected area and rub in gently. Do not use on healthy skin or over large areas of skin. Do not get this medicine in your eyes. If you do, rinse out with plenty of cool tap water. When applying to the groin area, apply a limited amount and do not use for longer than 2 weeks unless directed to by your doctor or health care professional. Do not cover or wrap the treated area with an airtight bandage (such as a plastic bandage). Use the full course of treatment prescribed, even if you think the infection is getting better. Use at regular intervals. Do not use your medicine more often than directed. Do not use this medicine for any condition other than the one for which it was prescribed. Talk to your pediatrician regarding the use of this medicine in children.  While this drug may be prescribed for selected conditions, precautions do apply. Children being treated in the diaper area should not wear tight-fitting diapers or plastic pants. Elderly patients are more likely to have damaged skin through aging, and this may increase side effects. This medicine should only be used for brief periods and infrequently in older patients. Overdosage: If you think you have taken too much of this medicine contact a poison control center or emergency room at once. NOTE: This medicine is only for you. Do not share this medicine with others. What if I miss a dose? If you miss a dose, use it as soon as you can. If it is almost time for your next dose, use only that dose. Do not use double or extra doses. What may interact with this medicine? Interactions are not expected. Do not use any other skin products on the affected area without telling your doctor or health care professional. This list may not describe all possible interactions. Give your health care provider a list of all the medicines, herbs, non-prescription drugs, or dietary supplements you use. Also tell them if you smoke, drink alcohol, or use illegal drugs. Some items may interact with your medicine. What should I watch for while using this medicine? Tell your doctor or health care professional if your symptoms do not start to get better within 1 week when treating the groin area or within 2 weeks when treating the feet. . Tell your doctor or health care professional if you develop sores or blisters that do not heal properly. If your skin  infection returns after stopping this medicine, contact your doctor or health care professional. If you are using this medicine to treat an infection in the groin area, do not wear underwear that is tight-fitting or made from synthetic fibers such as rayon or nylon. Instead, wear loose-fitting, cotton underwear. Also dry the area completely after bathing. What side effects may I  notice from receiving this medicine? Side effects that you should report to your doctor or health care professional as soon as possible: -burning or itching of the skin -dark red spots on the skin -loss of feeling on skin -painful, red, pus-filled blisters in hair follicles -skin infection -thinning of the skin or sunburn: more likely if applied to the face Side effects that usually do not require medical attention (report to your doctor or health care professional if they continue or are bothersome): -dry or peeling skin -skin irritation This list may not describe all possible side effects. Call your doctor for medical advice about side effects. You may report side effects to FDA at 1-800-FDA-1088. Where should I keep my medicine? Keep out of the reach of children. Store at room temperature between 15 and 30 degrees C (59 and 86 degrees F). Do not freeze. Throw away any unused medicine after the expiration date. NOTE: This sheet is a summary. It may not cover all possible information. If you have questions about this medicine, talk to your doctor, pharmacist, or health care provider.  2017 Elsevier/Gold Standard (2007-09-02 17:29:26)  Skin Yeast Infection Skin yeast infection is a condition in which there is an overgrowth of yeast (candida) that normally lives on the skin. This condition usually occurs in areas of the skin that are constantly warm and moist, such as the armpits or the groin. What are the causes? This condition is caused by a change in the normal balance of the yeast and bacteria that live on the skin. What increases the risk? This condition is more likely to develop in:  People who are obese.  Pregnant women.  Women who take birth control pills.  People who have diabetes.  People who take antibiotic medicines.  People who take steroid medicines.  People who are malnourished.  People who have a weak defense (immune) system.  People who are 70 years of age or  older. What are the signs or symptoms? Symptoms of this condition include:  A red, swollen area of the skin.  Bumps on the skin.  Itchiness. How is this diagnosed? This condition is diagnosed with a medical history and physical exam. Your health care provider may check for yeast by taking light scrapings of the skin to be viewed under a microscope. How is this treated? This condition is treated with medicine. Medicines may be prescribed or be available over-the-counter. The medicines may be:  Taken by mouth (orally).  Applied as a cream. Follow these instructions at home:  Take or apply over-the-counter and prescription medicines only as told by your health care provider.  Eat more yogurt. This may help to keep your yeast infection from returning.  Maintain a healthy weight. If you need help losing weight, talk with your health care provider.  Keep your skin clean and dry.  If you have diabetes, keep your blood sugar under control. Contact a health care provider if:  Your symptoms go away and then return.  Your symptoms do not get better with treatment.  Your symptoms get worse.  Your rash spreads.  You have a fever or chills.  You  have new symptoms.  You have new warmth or redness of your skin. This information is not intended to replace advice given to you by your health care provider. Make sure you discuss any questions you have with your health care provider. Document Released: 10/28/2010 Document Revised: 10/06/2015 Document Reviewed: 08/13/2014 Elsevier Interactive Patient Education  2017 [employer].

## 2016-02-13 NOTE — Progress Notes (Signed)
Patient: Faith Thompson, Female    DOB: 09/27/1945, 70 y.o.   MRN: [id number] Visit Date: 02/13/2016  Today's Provider: Mar Daring, PA-C   Chief Complaint  Patient presents with  . Rash   Subjective:   HPI  Rash: Patient complains of rash involving the beneath both breasts Rash started 3 days ago. Appearance of rash at onset: Color of lesion(s): red. Rash has changed over time. Discomfort associated with rash: is pruritic.  Associated symptoms: none. Denies: abdominal pain, arthralgia, congestion, cough, decrease in appetite, decrease in energy level, fever, headache, irritability, myalgia, nausea, sore throat and vomiting. Patient has not had previous evaluation of rash. Patient has not had new exposures (soaps, lotions, laundry detergents, foods, medications, plants, insects or animals.) Pt has tried Lamisil and Benadryl, with relief of the itching but not the appearance of the rash.  Review of Systems  Constitutional: Negative for activity change, appetite change, chills, diaphoresis, fatigue and fever.  Respiratory: Negative for cough and shortness of breath.   Cardiovascular: Negative for chest pain, palpitations and leg swelling.  Musculoskeletal: Negative for arthralgias and myalgias.  Skin: Positive for rash.    Social History      She  reports that she has never smoked. She has never used smokeless tobacco. She reports that she drinks alcohol. She reports that she does not use drugs.       Social History   Social History  . Marital status: Married    Spouse name: Cori Razor Sr.  . Number of children: 3  . Years of education: 12   Occupational History  . Homemaker    Social History Main Topics  . Smoking status: Never Smoker  . Smokeless tobacco: Never Used  . Alcohol use 0.0 oz/week     Comment: occasional very rarely social events  . Drug use: No  . Sexual activity: Yes    Partners: Male   Other Topics Concern  . None   Social History Narrative    Smoke alarm and carbon monoxide detector in the home.No guns in the home.Always uses seat belts.    Marital status:  Married x 83 years Happily no abuse.       Caffeine use: Coffee 2 servings per day.       Children: 3 children; 4 grandchildren.       Tobacco: none      Alcohol:  none       Drugs:  none      Exercise: none      Advanced Directives:  Patient does have LIVING WILL patient does have HCPOA. Husband is HCPOA: FULL CODE.      ADLs: independent with ADLs.  No assistant devices.    Past Medical History:  Diagnosis Date  . Allergic rhinitis, cause unspecified   . Anemia   . Asthma   . 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  . Basal cell carcinoma 06/09/12   R facial region; s/p Mohs procedure at [facility].  . Depression   . Dysthymic disorder   . Factor V Leiden mutation ([place])   . 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  . Gastritis 11/24/2011   EGD: +gastritis; AVM gastric region, pulsatile mass in stomach  . GERD (gastroesophageal reflux disease)   . Macular degeneration (senile) of retina, unspecified   . Obesity, unspecified   . OSA  on CPAP 05/24/2012   sleep study +OSA; CPAP titration placed.  . Overactive bladder   . Ulcer [facility])      Patient Active Problem List   Diagnosis Date Noted  . Restless leg syndrome 10/11/2012  . OSA on CPAP 09/28/2012  . Routine general medical examination at a health care facility 09/28/2012  . Urinary incontinence 02/29/2012  . Snoring 02/29/2012  . Hypersomnolence 02/29/2012  . Overactive bladder 02/09/2012  . Insomnia 02/09/2012  . AVM (arteriovenous malformation) of stomach, acquired 02/09/2012  . Gastritis 02/09/2012  . Depression 02/09/2012  . Iron deficiency anemia 02/09/2012  . AVM (arteriovenous malformation) of colon with hemorrhage 02/09/2012  . ANXIETY, SITUATIONAL 10/13/2006    . FACTOR V DEFICIENCY 10/06/2006  . Macular degeneration (senile) of retina 10/06/2006  . SYMPTOM, DISTURBANCE, SLEEP NOS 10/06/2006  . Obesity 06/10/2006  . Allergic rhinitis 06/10/2006  . GERD 06/10/2006  . OSTEOARTHRITIS 06/10/2006    Past Surgical History:  Procedure Laterality Date  . ABDOMINAL HYSTERECTOMY  1996   fibroids; ovaries removed.    . CHOLECYSTECTOMY  1993  . COLONOSCOPY  10/2011   AVM bleeding in colon s/p cauterization.  Symptoms: anemia, hemoccult +.  . ESOPHAGOGASTRODUODENOSCOPY  10/2011   gastritis, AVM of stomach non-bleeding, pulsatile mass of stomach.  Symptoms: iron defficiency anemia with hemocult +.  . ESOPHAGOGASTRODUODENOSCOPY  [id number]   NL esophagus.-Bile gastritis.This was biopsied, pulsatile extrinsic mass at the bulb.A few non bleeding angioectasias in the stomach. Sucralfate tab at 1 gram po 2x day indefintely. repeat upper endoscopy prn for surveillance. Schedule an ultrasound of the abdomen to look for aneurysm of gastroduodenal artery.  . Sleep study  05/24/2012   +OSA; CPAP fitted.  . TONSILLECTOMY  age 36    Family History        Family Status  Relation Status  . Mother Deceased at age 13   urosepsis  . Father Deceased at age 74   unknown  . Sister Alive  . Maternal Uncle Deceased   MI  . Sister Alive  . Sister Alive  . Sister Alive  . Paternal Grandmother         Her family history includes Arthritis in her sister; Cancer in her father; Diabetes in her mother; Heart disease in her maternal uncle; Heart murmur in her sister; Hypertension in her sister; Mental illness in her sister; Stroke in her paternal grandmother.     Allergies  Allergen Reactions  . Nsaids Other (See Comments)    GI Bleed, AVMs  . Codeine Itching  . 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 Adhesive [Tape] Rash    Any tape - paper, bandaid     Current Outpatient Prescriptions:  .  calcium carbonate (OS-CAL) 600 MG TABS, Take 600 mg by  mouth 2 (two) times daily with a meal., Disp: , Rfl:  .  cetirizine (ZYRTEC) 10 MG tablet, Take 10 mg by mouth daily., Disp: , Rfl:  .  ferrous sulfate 325 (65 FE) MG tablet, Take 325 mg by mouth daily with breakfast., Disp: , Rfl:  .  Lutein 6 MG CAPS, Take by mouth daily., Disp: , Rfl:  .  mirabegron ER (MYRBETRIQ) 50 MG TB24 tablet, Take 50 mg by mouth., Disp: , Rfl:  .  omeprazole (PRILOSEC) 20 MG capsule, Take 2 capsules (40 mg total) by mouth 2 (two) times daily before a meal., Disp: 90 capsule, Rfl: 3 .  sertraline (ZOLOFT) 50 MG tablet,  TAKE ONE TABLET BY MOUTH EVERY DAY, Disp: 90 tablet, Rfl: 3   Patient Care Team: Mar Daring, PA-C as PCP - General (Family Medicine)      Objective:   Vitals: BP 132/64 (BP Location: Left Arm, Patient Position: Sitting, Cuff Size: Large)   Pulse 80   Temp 97.6 F (36.4 C) (Oral)   Resp 16   Wt 250 lb (113.4 kg)   BMI 47.24 kg/m    Physical Exam  Constitutional: She appears well-developed and well-nourished. No distress.  Neck: Normal range of motion. Neck supple.  Cardiovascular: Normal rate, regular rhythm and normal heart sounds.  Exam reveals no gallop and no friction rub.   No murmur heard. Pulmonary/Chest: Effort normal and breath sounds normal. No respiratory distress. She has no wheezes. She has no rales.  Skin: Rash noted. Rash is maculopapular. She is not diaphoretic.     Vitals reviewed.    Assessment & Plan:    1. Yeast dermatitis Worsening. Will give Mycolog cream as below. She is to keep the area clean and dry. She is to call if no improvement.  - nystatin-triamcinolone ointment (MYCOLOG); Apply 1 application topically 2 (two) times daily.  Dispense: 30 g; Refill: 0  2. 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 Follows with Dr. Daun Peacock, last seen 01/20/16 and cleared for one year.  Patient seen and examined by Mar Daring, PA-C, and note scribed by Renaldo Fiddler, CMA.   Mar Daring, PA-C  [facility]

## 2016-02-27 ENCOUNTER — Telehealth: Payer: Self-pay | Admitting: Physician Assistant

## 2016-02-27 ENCOUNTER — Other Ambulatory Visit: Payer: Self-pay | Admitting: Physician Assistant

## 2016-02-27 DIAGNOSIS — F3342 Major depressive disorder, recurrent, in full remission: Secondary | ICD-10-CM

## 2016-02-27 MED ORDER — SERTRALINE HCL 50 MG PO TABS: ORAL_TABLET | ORAL | 3 refills | Status: DC

## 2016-02-27 NOTE — Telephone Encounter (Signed)
Refill sent.

## 2016-02-27 NOTE — Telephone Encounter (Signed)
Try to request the refill for the Sertraline but is already pending.  Thanks,  -Lucas Exline

## 2016-02-27 NOTE — Telephone Encounter (Signed)
Pt contacted office for refill request on the following medications:  sertraline (ZOLOFT) 50 MG tablet.  [facility].  [id number]

## 2016-03-10 ENCOUNTER — Encounter: Payer: Self-pay | Admitting: Dietician

## 2016-03-10 ENCOUNTER — Encounter: Payer: Medicare Other | Attending: Physician Assistant | Admitting: Dietician

## 2016-03-10 DIAGNOSIS — Z713 Dietary counseling and surveillance: Secondary | ICD-10-CM | POA: Diagnosis not present

## 2016-03-10 DIAGNOSIS — E119 Type 2 diabetes mellitus without complications: Secondary | ICD-10-CM | POA: Diagnosis not present

## 2016-03-10 DIAGNOSIS — Q2739 Arteriovenous malformation, other site: Secondary | ICD-10-CM | POA: Insufficient documentation

## 2016-03-10 DIAGNOSIS — G4730 Sleep apnea, unspecified: Secondary | ICD-10-CM | POA: Insufficient documentation

## 2016-03-10 DIAGNOSIS — K219 Gastro-esophageal reflux disease without esophagitis: Secondary | ICD-10-CM | POA: Diagnosis not present

## 2016-03-10 NOTE — Patient Instructions (Signed)
   Continue with regular exercise, great job!  Resume healthy food choices and portions.   Check weight regularly, can come to the Nutrition office to check.

## 2016-03-10 NOTE — Progress Notes (Signed)
Medical Nutrition Therapy: Visit start time: 1010  end time: 1040  Assessment:  Diagnosis: obesity Medical history changes: no changes per patient Psychosocial issues/ stress concerns: none  Current weight: 245.5lbs  Height: 5'1" Medications, supplement changes: no changes per patient  Progress and evaluation: Weight gain of 3.1lbs since 01/09/16. Patient reports some extra eating over Thanksgiving and Christmas holidays. She has maintained regular exercise, and is very much enjoying her water exercise, which has strengthened her knees so that she is now able to use pool ladder. She is going on an out of state trip next week and is working on plans to maintain exercise while away.    Physical activity: water aerobics 3 times a week + additional walking   Dietary Intake:  Usual eating pattern includes 2 meals and 0-1 snacks per day. Dining out frequency: not assessed today.  Breakfast: Brunch -- eggs, fruit, toast Snack: none  Lunch: usually none, sometimes fruit-- apple, orange, banana Snack: none Supper: chicken, ground [place], soups, vegetables Snack: none Beverages: water, lemonade, unsweetened tea  Nutrition Care Education: Topics covered: weight management  Basic nutrition: appropriate meal and snack schedule   Weight control: reviewed progress since previous visit on 01/09/16; discussed benefits of eating at regular intervals and advised going no more than 6 hours during the day and 12 hours overnight without eating. Encouraged considering an evening snack if eating an early dinner. Encouraged her to check weight regularly, and to keep short-term food diary if/ when she feels she is backsliding with diet.   Nutritional Diagnosis:  Faith Thompson-3.3 Overweight/obesity As related to history of excess calories, inactivity.  As evidenced by patient report.  Intervention: Discussion as noted above.   Commended patient for her exercise progress and planning ahead for challenging circumstances.     No new goals, patient will resume lower-calorie food choices and maintain regular exercise.   Learner/ who was taught:  . Patient   Level of understanding: Marland Kitchen Verbalizes/ demonstrates competency  Demonstrated degree of understanding via:   Teach back Learning barriers: . None  Willingness to learn/ readiness for change: . Eager, change in progress  Monitoring and Evaluation:  Dietary intake, exercise, and body weight      follow up: 05/12/16

## 2016-03-13 DIAGNOSIS — Z1231 Encounter for screening mammogram for malignant neoplasm of breast: Secondary | ICD-10-CM | POA: Diagnosis not present

## 2016-03-13 DIAGNOSIS — Z136 Encounter for screening for cardiovascular disorders: Secondary | ICD-10-CM | POA: Diagnosis not present

## 2016-03-13 DIAGNOSIS — E669 Obesity, unspecified: Secondary | ICD-10-CM | POA: Diagnosis not present

## 2016-03-13 DIAGNOSIS — M8589 Other specified disorders of bone density and structure, multiple sites: Secondary | ICD-10-CM | POA: Insufficient documentation

## 2016-03-13 DIAGNOSIS — F329 Major depressive disorder, single episode, unspecified: Secondary | ICD-10-CM | POA: Diagnosis not present

## 2016-03-13 DIAGNOSIS — H3530 Unspecified macular degeneration: Secondary | ICD-10-CM | POA: Insufficient documentation

## 2016-03-13 DIAGNOSIS — Z0000 Encounter for general adult medical examination without abnormal findings: Secondary | ICD-10-CM | POA: Diagnosis not present

## 2016-03-13 DIAGNOSIS — Z6841 Body Mass Index (BMI) 40.0 and over, adult: Secondary | ICD-10-CM | POA: Diagnosis not present

## 2016-03-13 DIAGNOSIS — M859 Disorder of bone density and structure, unspecified: Secondary | ICD-10-CM | POA: Diagnosis not present

## 2016-03-18 ENCOUNTER — Other Ambulatory Visit: Payer: Self-pay | Admitting: Internal Medicine

## 2016-03-18 DIAGNOSIS — Z1231 Encounter for screening mammogram for malignant neoplasm of breast: Secondary | ICD-10-CM

## 2016-03-30 DIAGNOSIS — R05 Cough: Secondary | ICD-10-CM | POA: Diagnosis not present

## 2016-04-16 ENCOUNTER — Other Ambulatory Visit: Payer: Self-pay | Admitting: *Deleted

## 2016-04-16 ENCOUNTER — Other Ambulatory Visit: Payer: Self-pay | Admitting: Family Medicine

## 2016-04-16 ENCOUNTER — Other Ambulatory Visit: Payer: Self-pay | Admitting: Internal Medicine

## 2016-04-16 DIAGNOSIS — Z1231 Encounter for screening mammogram for malignant neoplasm of breast: Secondary | ICD-10-CM

## 2016-04-21 ENCOUNTER — Other Ambulatory Visit: Payer: Self-pay | Admitting: Internal Medicine

## 2016-04-21 DIAGNOSIS — Z1231 Encounter for screening mammogram for malignant neoplasm of breast: Secondary | ICD-10-CM

## 2016-04-22 DIAGNOSIS — N3946 Mixed incontinence: Secondary | ICD-10-CM | POA: Diagnosis not present

## 2016-04-27 DIAGNOSIS — H2513 Age-related nuclear cataract, bilateral: Secondary | ICD-10-CM | POA: Diagnosis not present

## 2016-04-27 DIAGNOSIS — H353132 Nonexudative age-related macular degeneration, bilateral, intermediate dry stage: Secondary | ICD-10-CM | POA: Diagnosis not present

## 2016-04-30 ENCOUNTER — Encounter: Payer: Self-pay | Admitting: Physician Assistant

## 2016-04-30 ENCOUNTER — Ambulatory Visit (INDEPENDENT_AMBULATORY_CARE_PROVIDER_SITE_OTHER): Payer: Medicare Other | Admitting: Physician Assistant

## 2016-04-30 VITALS — BP 120/60 | HR 83 | Temp 98.0°F | Resp 16 | Wt 247.0 lb

## 2016-04-30 DIAGNOSIS — I780 Hereditary hemorrhagic telangiectasia: Secondary | ICD-10-CM

## 2016-04-30 DIAGNOSIS — R5383 Other fatigue: Secondary | ICD-10-CM | POA: Diagnosis not present

## 2016-04-30 DIAGNOSIS — K31819 Angiodysplasia of stomach and duodenum without bleeding: Secondary | ICD-10-CM

## 2016-04-30 DIAGNOSIS — M8589 Other specified disorders of bone density and structure, multiple sites: Secondary | ICD-10-CM

## 2016-04-30 DIAGNOSIS — Z780 Asymptomatic menopausal state: Secondary | ICD-10-CM

## 2016-04-30 DIAGNOSIS — D500 Iron deficiency anemia secondary to blood loss (chronic): Secondary | ICD-10-CM | POA: Diagnosis not present

## 2016-04-30 DIAGNOSIS — N951 Menopausal and female climacteric states: Secondary | ICD-10-CM | POA: Diagnosis not present

## 2016-04-30 NOTE — Patient Instructions (Signed)

## 2016-04-30 NOTE — Progress Notes (Signed)
Patient: Faith Thompson Female    DOB: 1945-12-25   71 y.o.   MRN: [id number] Visit Date: 04/30/2016  Today's Provider: Mar Daring, PA-C   Chief Complaint  Patient presents with  . Fatigue   Subjective:    HPI Fatigue: Patient complains of fatigue. Symptoms began 2-3 weeks. Sentinal symptom the patient feels fatigue began with: fatigue, weakness, lightheaded, dizziness and sleeping a lot. Symptoms of her fatigue have been headaches.Patient has had two episodes of nosebleeds. Patient describes the following psychologic symptoms: none.  Patient denies fever, significant change in weight and cold intolerance, constipation and change in hair texture.. Symptoms have unchanged. Severity has been mild. She reports that she is feeling the same way when she was diagnosed with HHT 11/18/15. If she needs to go back to GI she sees Dr. Cheri Rous, [facility]. She is taking her iron supplement.    Allergies  Allergen Reactions  . Nsaids Other (See Comments)    GI Bleed, AVMs  . Codeine Itching  . 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 Adhesive [Tape] Rash    Any tape - paper, bandaid     Current Outpatient Prescriptions:  .  calcium carbonate (OS-CAL) 600 MG TABS, Take 600 mg by mouth 2 (two) times daily with a meal., Disp: , Rfl:  .  cetirizine (ZYRTEC) 10 MG tablet, Take 10 mg by mouth daily., Disp: , Rfl:  .  ferrous sulfate 325 (65 FE) MG tablet, Take 325 mg by mouth daily with breakfast., Disp: , Rfl:  .  Lutein 6 MG CAPS, Take by mouth daily., Disp: , Rfl:  .  omeprazole (PRILOSEC) 20 MG capsule, Take 2 capsules (40 mg total) by mouth 2 (two) times daily before a meal., Disp: 90 capsule, Rfl: 3 .  oxybutynin (DITROPAN-XL) 5 MG 24 hr tablet, Take 5 mg by mouth., Disp: , Rfl:  .  sertraline (ZOLOFT) 50 MG tablet, TAKE ONE TABLET BY MOUTH EVERY DAY, Disp: 90 tablet, Rfl: 3  Review of Systems  Constitutional: Positive for fatigue.  HENT: Positive for  nosebleeds.   Respiratory: Negative.   Cardiovascular: Negative for chest pain, palpitations and leg swelling.  Gastrointestinal: Negative.   Neurological: Positive for dizziness, weakness and light-headedness.    Social History  Substance Use Topics  . Smoking status: Never Smoker  . Smokeless tobacco: Never Used  . Alcohol use 0.0 oz/week     Comment: occasional very rarely social events   Objective:   BP 120/60 (BP Location: Right Arm, Patient Position: Sitting, Cuff Size: Large)   Pulse 83   Temp 98 F (36.7 C) (Oral)   Resp 16   Wt 247 lb (112 kg)   SpO2 98%   BMI 46.67 kg/m    Physical Exam  Constitutional: She is oriented to person, place, and time. She appears well-developed and well-nourished. No distress.  Neck: Normal range of motion. Neck supple. No JVD present. No tracheal deviation present. No thyromegaly present.  Cardiovascular: Normal rate, regular rhythm and normal heart sounds.  Exam reveals no gallop and no friction rub.   No murmur heard. Pulmonary/Chest: Effort normal and breath sounds normal. No respiratory distress. She has no wheezes. She has no rales.  Musculoskeletal: She exhibits no edema.  Lymphadenopathy:    She has no cervical adenopathy.  Neurological: She is alert and oriented to person, place, and time.  Skin: She is not diaphoretic.  Psychiatric:  She has a normal mood and affect. Her behavior is normal. Judgment and thought content normal.  Vitals reviewed.    Assessment & Plan:     1. HHT (hereditary hemorrhagic telangiectasia) ([place]) Due to her history of HHT and anemia I will check labs as below to R/O this as a cause of her symptoms. Fecal occult today in the office was negative. I will follow up with her pending her results. She is to call the office if symptoms continue to worsen.  - CBC w/Diff/Platelet - Iron Binding Cap (TIBC) - Ferritin - Vitamin D (25 hydroxy) - Thyroid Panel With TSH - IFOBT POC (occult bld, rslt in  office)  2. Fatigue, unspecified type See above medical treatment plan. - CBC w/Diff/Platelet - Iron Binding Cap (TIBC) - Ferritin - Vitamin D (25 hydroxy) - Thyroid Panel With TSH - IFOBT POC (occult bld, rslt in office) - Mona, PA-C  [facility]

## 2016-05-01 ENCOUNTER — Telehealth: Payer: Self-pay | Admitting: Emergency Medicine

## 2016-05-01 LAB — THYROID PANEL WITH TSH
FREE THYROXINE INDEX: 2.8 (ref 1.2–4.9)
T3 UPTAKE RATIO: 27 % (ref 24–39)
T4, Total: 10.5 ug/dL (ref 4.5–12.0)
TSH: 1.14 u[IU]/mL (ref 0.450–4.500)

## 2016-05-01 LAB — CBC WITH DIFFERENTIAL/PLATELET
BASOS ABS: 0.1 10*3/uL (ref 0.0–0.2)
Basos: 1 %
EOS (ABSOLUTE): 0.1 10*3/uL (ref 0.0–0.4)
Eos: 2 %
HEMOGLOBIN: 15 g/dL (ref 11.1–15.9)
Hematocrit: 43 % (ref 34.0–46.6)
IMMATURE GRANS (ABS): 0 10*3/uL (ref 0.0–0.1)
IMMATURE GRANULOCYTES: 0 %
LYMPHS: 39 %
Lymphocytes Absolute: 2.4 10*3/uL (ref 0.7–3.1)
MCH: 30.5 pg (ref 26.6–33.0)
MCHC: 34.9 g/dL (ref 31.5–35.7)
MCV: 87 fL (ref 79–97)
MONOCYTES: 6 %
Monocytes Absolute: 0.4 10*3/uL (ref 0.1–0.9)
NEUTROS PCT: 52 %
Neutrophils Absolute: 3.2 10*3/uL (ref 1.4–7.0)
Platelets: 298 10*3/uL (ref 150–379)
RBC: 4.92 x10E6/uL (ref 3.77–5.28)
RDW: 14.5 % (ref 12.3–15.4)
WBC: 6.2 10*3/uL (ref 3.4–10.8)

## 2016-05-01 LAB — VITAMIN D 25 HYDROXY (VIT D DEFICIENCY, FRACTURES): Vit D, 25-Hydroxy: 43 ng/mL (ref 30.0–100.0)

## 2016-05-01 LAB — IRON AND TIBC
IRON: 90 ug/dL (ref 27–139)
Iron Saturation: 33 % (ref 15–55)
TIBC: 276 ug/dL (ref 250–450)
UIBC: 186 ug/dL (ref 118–369)

## 2016-05-01 LAB — FERRITIN: FERRITIN: 206 ng/mL — AB (ref 15–150)

## 2016-05-01 LAB — VITAMIN B12: Vitamin B-12: 595 pg/mL (ref 232–1245)

## 2016-05-01 NOTE — Telephone Encounter (Signed)
Spoke with pt about results. She reports that she is already using a CPAP machine nightly.

## 2016-05-01 NOTE — Telephone Encounter (Signed)
Noted  

## 2016-05-01 NOTE — Telephone Encounter (Signed)
-----   Message from Mar Daring, [place] sent at 05/01/2016  8:14 AM EST ----- All labs were normal. HgB is up to 15 and iron is normal. B12 and D were both normal as well. Unsure of cause of fatigue. Are you sleeping ok? Any snoring or witnessed gasping for breath?

## 2016-05-07 LAB — IFOBT (OCCULT BLOOD): IFOBT: NEGATIVE

## 2016-05-12 ENCOUNTER — Ambulatory Visit: Payer: Medicare Other | Admitting: Dietician

## 2016-06-29 DIAGNOSIS — R040 Epistaxis: Secondary | ICD-10-CM | POA: Diagnosis not present

## 2016-06-29 DIAGNOSIS — Q2733 Arteriovenous malformation of digestive system vessel: Secondary | ICD-10-CM | POA: Diagnosis not present

## 2016-06-29 DIAGNOSIS — I780 Hereditary hemorrhagic telangiectasia: Secondary | ICD-10-CM | POA: Diagnosis not present

## 2016-06-29 DIAGNOSIS — D500 Iron deficiency anemia secondary to blood loss (chronic): Secondary | ICD-10-CM | POA: Diagnosis not present

## 2016-06-29 DIAGNOSIS — D6851 Activated protein C resistance: Secondary | ICD-10-CM | POA: Diagnosis not present

## 2016-07-01 ENCOUNTER — Ambulatory Visit (INDEPENDENT_AMBULATORY_CARE_PROVIDER_SITE_OTHER): Payer: Medicare Other | Admitting: Physician Assistant

## 2016-07-01 ENCOUNTER — Encounter: Payer: Self-pay | Admitting: Physician Assistant

## 2016-07-01 VITALS — BP 124/68 | HR 70 | Temp 98.0°F | Wt 252.6 lb

## 2016-07-01 DIAGNOSIS — K219 Gastro-esophageal reflux disease without esophagitis: Secondary | ICD-10-CM | POA: Diagnosis not present

## 2016-07-01 DIAGNOSIS — R079 Chest pain, unspecified: Secondary | ICD-10-CM | POA: Diagnosis not present

## 2016-07-01 NOTE — Patient Instructions (Signed)
Omeprazole twice daily and add zantac 150mg  if no improvement  Food Choices for Gastroesophageal Reflux Disease, Adult When you have gastroesophageal reflux disease (GERD), the foods you eat and your eating habits are very important. Choosing the right foods can help ease the discomfort of GERD. Consider working with a diet and nutrition specialist (dietitian) to help you make healthy food choices. What general guidelines should I follow? Eating plan   Choose healthy foods low in fat, such as fruits, vegetables, whole grains, low-fat dairy products, and lean meat, fish, and poultry.  Eat frequent, small meals instead of three large meals each day. Eat your meals slowly, in a relaxed setting. Avoid bending over or lying down until 2-3 hours after eating.  Limit high-fat foods such as fatty meats or fried foods.  Limit your intake of oils, butter, and shortening to less than 8 teaspoons each day.  Avoid the following:  Foods that cause symptoms. These may be different for different people. Keep a food diary to keep track of foods that cause symptoms.  Alcohol.  Drinking large amounts of liquid with meals.  Eating meals during the 2-3 hours before bed.  Cook foods using methods other than frying. This may include baking, grilling, or broiling. Lifestyle    Maintain a healthy weight. Ask your health care provider what weight is healthy for you. If you need to lose weight, work with your health care provider to do so safely.  Exercise for at least 30 minutes on 5 or more days each week, or as told by your health care provider.  Avoid wearing clothes that fit tightly around your waist and chest.  Do not use any products that contain nicotine or tobacco, such as cigarettes and e-cigarettes. If you need help quitting, ask your health care provider.  Sleep with the head of your bed raised. Use a wedge under the mattress or blocks under the bed frame to raise the head of the bed. What  foods are not recommended? The items listed may not be a complete list. Talk with your dietitian about what dietary choices are best for you. Grains  Pastries or quick breads with added fat. [place] toast. Vegetables  Deep fried vegetables. [place] fries. Any vegetables prepared with added fat. Any vegetables that cause symptoms. For some people this may include tomatoes and tomato products, chili peppers, onions and garlic, and horseradish. Fruits  Any fruits prepared with added fat. Any fruits that cause symptoms. For some people this may include citrus fruits, such as oranges, grapefruit, pineapple, and lemons. Meats and other protein foods  High-fat meats, such as fatty beef or pork, hot dogs, ribs, ham, sausage, salami and bacon. Fried meat or protein, including fried fish and fried chicken. Nuts and nut butters. Dairy  Whole milk and chocolate milk. Sour cream. Cream. Ice cream. Cream cheese. Milk shakes. Beverages  Coffee and tea, with or without caffeine. Carbonated beverages. Sodas. Energy drinks. Fruit juice made with acidic fruits (such as orange or grapefruit). Tomato juice. Alcoholic drinks. Fats and oils  Butter. Margarine. Shortening. Ghee. Sweets and desserts  Chocolate and cocoa. Donuts. Seasoning and other foods  Pepper. Peppermint and spearmint. Any condiments, herbs, or seasonings that cause symptoms. For some people, this may include curry, hot sauce, or vinegar-based salad dressings. Summary  When you have gastroesophageal reflux disease (GERD), food and lifestyle choices are very important to help ease the discomfort of GERD.  Eat frequent, small meals instead of three large meals each day.  Eat your meals slowly, in a relaxed setting. Avoid bending over or lying down until 2-3 hours after eating.  Limit high-fat foods such as fatty meat or fried foods. This information is not intended to replace advice given to you by your health care provider. Make sure you discuss  any questions you have with your health care provider. Document Released: 02/09/2005 Document Revised: 02/11/2016 Document Reviewed: 02/11/2016 Elsevier Interactive Patient Education  2017 [employer].

## 2016-07-01 NOTE — Progress Notes (Signed)
Patient: Faith Thompson Female    DOB: 1945-05-14   71 y.o.   MRN: [id number] Visit Date: 07/06/2016  Today's Provider: Mar Daring, PA-C   Chief Complaint  Patient presents with  . Gastroesophageal Reflux   Subjective:    Chest Pain   This is a new problem. Episode onset: Sunday. The onset quality is sudden. The problem occurs constantly. The problem has been unchanged. The pain is present in the lateral region (right side). The quality of the pain is described as pressure and sharp. The pain radiates to the right shoulder. Associated symptoms include abdominal pain and weakness. Pertinent negatives include no cough, dizziness, headaches, nausea, numbness, palpitations, shortness of breath or vomiting. Associated symptoms comments: Difficulty swallowing. Associated with: eating. Risk factors include obesity.  Her past medical history is significant for anxiety/panic attacks.  Her family medical history is significant for diabetes (mother) and heart disease (father ).     Previous Medications   CALCIUM CARBONATE (OS-CAL) 600 MG TABS    Take 600 mg by mouth 2 (two) times daily with a meal.   CETIRIZINE (ZYRTEC) 10 MG TABLET    Take 10 mg by mouth daily.   FERROUS SULFATE 325 (65 FE) MG TABLET    Take 325 mg by mouth daily with breakfast.   LUTEIN 6 MG CAPS    Take by mouth daily.   OMEPRAZOLE (PRILOSEC) 20 MG CAPSULE    Take 2 capsules (40 mg total) by mouth 2 (two) times daily before a meal.   OXYBUTYNIN (DITROPAN-XL) 5 MG 24 HR TABLET    Take 5 mg by mouth.   SERTRALINE (ZOLOFT) 50 MG TABLET    TAKE ONE TABLET BY MOUTH EVERY DAY    Review of Systems  Constitutional: Positive for fatigue.  Respiratory: Negative for cough, chest tightness, shortness of breath and wheezing.   Cardiovascular: Positive for chest pain. Negative for palpitations and leg swelling.  Gastrointestinal: Positive for abdominal pain. Negative for abdominal distention, anal bleeding, blood in stool,  constipation, diarrhea, nausea and vomiting.  Neurological: Positive for weakness. Negative for dizziness, numbness and headaches.    Social History  Substance Use Topics  . Smoking status: Never Smoker  . Smokeless tobacco: Never Used  . Alcohol use 0.0 oz/week     Comment: occasional very rarely social events   Objective:   BP 124/68 (BP Location: Right Arm, Patient Position: Sitting, Cuff Size: Normal)   Pulse 70   Temp 98 F (36.7 C) (Oral)   Wt 252 lb 9.6 oz (114.6 kg)   SpO2 98%   BMI 47.73 kg/m   Physical Exam  Constitutional: She appears well-developed and well-nourished. No distress.  Neck: Normal range of motion. Neck supple. No JVD present. No tracheal deviation present. No thyromegaly present.  Cardiovascular: Normal rate, regular rhythm and normal heart sounds.  Exam reveals no gallop and no friction rub.   No murmur heard. Pulmonary/Chest: Effort normal and breath sounds normal. No respiratory distress. She has no wheezes. She has no rales.  Abdominal: Soft. Bowel sounds are normal. She exhibits no distension and no mass. There is no hepatosplenomegaly. There is tenderness in the epigastric area. There is no rebound, no guarding and no CVA tenderness.  Lymphadenopathy:    She has no cervical adenopathy.  Skin: She is not diaphoretic.  Vitals reviewed.     Assessment & Plan:     1. Chest pain, unspecified type EKG reviewed by me and shows NSR rate  of 75, no ST elevations. Suspect uncontrolled GERD. 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 She had recently cut back on omeprazole. Advised to restart omeprazole 40mg  BID instead of once daily. She was also recently seen by her hematologist and had normal recheck. She has an appt with her GI doctor (Dr. Rushie Goltz) on 08/06/16. I advised her to call him and let him know as he may choose to see her sooner. She agrees.  - EKG 12-Lead  2. Gastroesophageal reflux disease, esophagitis presence not specified See above medical  treatment plan.   Follow up: Return if symptoms worsen or fail to improve.

## 2016-07-08 ENCOUNTER — Telehealth: Payer: Self-pay

## 2016-07-08 NOTE — Telephone Encounter (Signed)
Called spoke with Daelyn that the application form for disability parking placard is ready for pick up.

## 2016-07-10 ENCOUNTER — Ambulatory Visit
Admission: RE | Admit: 2016-07-10 | Discharge: 2016-07-10 | Disposition: A | Discharge status: Home / Self Care | Payer: Medicare Other | Source: Ambulatory Visit | Attending: Internal Medicine | Admitting: Internal Medicine

## 2016-07-10 DIAGNOSIS — Z1231 Encounter for screening mammogram for malignant neoplasm of breast: Secondary | ICD-10-CM | POA: Insufficient documentation

## 2016-07-13 ENCOUNTER — Encounter: Payer: Self-pay | Admitting: Dietician

## 2016-07-13 NOTE — Progress Notes (Signed)
Have not heard back from patient to reschedule the appointment she cancelled on 05/12/16. Sent discharge letter to MD office.

## 2016-07-14 DIAGNOSIS — H2513 Age-related nuclear cataract, bilateral: Secondary | ICD-10-CM | POA: Diagnosis not present

## 2016-07-23 DIAGNOSIS — K2990 Gastroduodenitis, unspecified, without bleeding: Secondary | ICD-10-CM | POA: Diagnosis not present

## 2016-07-23 DIAGNOSIS — K2970 Gastritis, unspecified, without bleeding: Secondary | ICD-10-CM | POA: Diagnosis not present

## 2016-07-23 DIAGNOSIS — R1310 Dysphagia, unspecified: Secondary | ICD-10-CM | POA: Diagnosis not present

## 2016-08-11 ENCOUNTER — Ambulatory Visit (INDEPENDENT_AMBULATORY_CARE_PROVIDER_SITE_OTHER): Payer: Medicare Other | Admitting: Physician Assistant

## 2016-08-11 ENCOUNTER — Encounter: Payer: Self-pay | Admitting: Physician Assistant

## 2016-08-11 VITALS — BP 128/72 | HR 80 | Temp 98.0°F | Resp 16 | Wt 255.0 lb

## 2016-08-11 DIAGNOSIS — L237 Allergic contact dermatitis due to plants, except food: Secondary | ICD-10-CM

## 2016-08-11 MED ORDER — TRIAMCINOLONE ACETONIDE 0.1 % EX CREA: 1.0000 "application " | TOPICAL_CREAM | Freq: Two times a day (BID) | CUTANEOUS | 0 refills | Status: AC

## 2016-08-11 NOTE — Patient Instructions (Signed)
Poison Ivy Dermatitis Poison ivy dermatitis is redness and soreness (inflammation) of the skin. It is caused by a chemical that is found on the leaves of the poison ivy plant. You may also have itching, a rash, and blisters. Symptoms often clear up in 1-2 weeks. You may get this condition by touching a poison ivy plant. You can also get it by touching something that has the chemical on it. This may include animals or objects that have come in contact with the plant. Follow these instructions at home: General instructions  Take or apply over-the-counter and prescription medicines only as told by your doctor.  If you touch poison ivy, wash your skin with soap and cold water right away.  Use hydrocortisone creams or calamine lotion as needed to help with itching.  Take oatmeal baths as needed. Use colloidal oatmeal. You can get this at a pharmacy or grocery store. Follow the instructions on the package.  Do not scratch or rub your skin.  While you have the rash, wash your clothes right after you wear them. Prevention  Know what poison ivy looks like so you can avoid it. This plant has three leaves with flowering branches on a single stem. The leaves are glossy. They have uneven edges that come to a point at the front.  If you have touched poison ivy, wash with soap and water right away. Be sure to wash under your fingernails.  When hiking or camping, wear long pants, a long-sleeved shirt, tall socks, and hiking boots. You can also use a lotion on your skin that helps to prevent contact with the chemical on the plant.  If you think that your clothes or outdoor gear came in contact with poison ivy, rinse them off with a garden hose before you bring them inside your house. Contact a doctor if:  You have open sores in the rash area.  You have more redness, swelling, or pain in the affected area.  You have redness that spreads beyond the rash area.  You have fluid, blood, or pus coming from  the affected area.  You have a fever.  You have a rash over a large area of your body.  You have a rash on your eyes, mouth, or genitals.  Your rash does not get better after a few days. Get help right away if:  Your face swells or your eyes swell shut.  You have trouble breathing.  You have trouble swallowing. This information is not intended to replace advice given to you by your health care provider. Make sure you discuss any questions you have with your health care provider. Document Released: 03/14/2010 Document Revised: 07/18/2015 Document Reviewed: 07/18/2014 Elsevier Interactive Patient Education  2018 [employer] Inc[employer]  

## 2016-08-11 NOTE — Progress Notes (Signed)
Patient: Faith Thompson Female    DOB: 07-06-1945   71 y.o.   MRN: [id number] Visit Date: 08/11/2016  Today's Provider: Trinna Post, PA-C   Chief Complaint  Patient presents with  . Rash   Subjective:      Faith Thompson is a 71 y/o woman with history of shingles presenting today with itchy rash x 3 days. Affects her right lower leg, blistering, itching. No contact that she knows of. No prodromal symptoms like pain or burning. No pus drainage. Worried it's shingles.  Rash  This is a new problem. The current episode started in the past 7 days. The problem is unchanged. The affected locations include the right lower leg. The rash is characterized by blistering and itchiness. She was exposed to nothing. Past treatments include anti-itch cream (Neosporin). The treatment provided mild relief.       Allergies  Allergen Reactions  . Nsaids Other (See Comments)    GI Bleed, AVMs  . Codeine Itching  . 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 Adhesive [Tape] Rash    Any tape - paper, bandaid     Current Outpatient Prescriptions:  .  calcium carbonate (OS-CAL) 600 MG TABS, Take 600 mg by mouth 2 (two) times daily with a meal., Disp: , Rfl:  .  cetirizine (ZYRTEC) 10 MG tablet, Take 10 mg by mouth daily., Disp: , Rfl:  .  ferrous sulfate 325 (65 FE) MG tablet, Take 325 mg by mouth daily with breakfast., Disp: , Rfl:  .  Lutein 6 MG CAPS, Take by mouth daily., Disp: , Rfl:  .  omeprazole (PRILOSEC) 20 MG capsule, Take 2 capsules (40 mg total) by mouth 2 (two) times daily before a meal., Disp: 90 capsule, Rfl: 3 .  oxybutynin (DITROPAN-XL) 5 MG 24 hr tablet, Take 5 mg by mouth., Disp: , Rfl:  .  sertraline (ZOLOFT) 50 MG tablet, TAKE ONE TABLET BY MOUTH EVERY DAY, Disp: 90 tablet, Rfl: 3 .  triamcinolone cream (KENALOG) 0.1 %, Apply 1 application topically 2 (two) times daily., Disp: 30 g, Rfl: 0  Review of Systems  Constitutional: Negative.   Musculoskeletal:  Negative.   Skin: Positive for rash. Negative for color change, pallor and wound.  Neurological: Negative for dizziness, light-headedness and headaches.    Social History  Substance Use Topics  . Smoking status: Never Smoker  . Smokeless tobacco: Never Used  . Alcohol use 0.0 oz/week     Comment: occasional very rarely social events   Objective:   BP 128/72 (BP Location: Left Arm, Patient Position: Sitting, Cuff Size: Large)   Pulse 80   Temp 98 F (36.7 C) (Oral)   Resp 16   Wt 255 lb (115.7 kg)   BMI 48.18 kg/m  Vitals:   08/11/16 1329  BP: 128/72  Pulse: 80  Resp: 16  Temp: 98 F (36.7 C)  TempSrc: Oral  Weight: 255 lb (115.7 kg)     Physical Exam  Constitutional: She is oriented to person, place, and time. She appears well-developed and well-nourished.  Cardiovascular: Normal rate.   Pulmonary/Chest: Effort normal.  Neurological: She is alert and oriented to person, place, and time.  Skin: Skin is warm and dry. Rash noted. There is erythema.  Photo of right leg below.  Psychiatric: She has a normal mood and affect. Her behavior is normal.   Media Information     Document Information  Photos  Right leg rash   08/11/2016 1:39 PM  Attached To:  Office Visit on 08/11/16 with Trinna Post, PA-C  Source Information   Paulene Floor  [place]:     1. Poison ivy dermatitis  - triamcinolone cream (KENALOG) 0.1 %; Apply 1 application topically 2 (two) times daily.  Dispense: 30 g; Refill: 0  Return if symptoms worsen or fail to improve.  The entirety of the information documented in the History of Present Illness, Review of Systems and Physical Exam were personally obtained by me. Portions of this information were initially documented by Ashley Royalty, CMA and reviewed by me for thoroughness and accuracy.        Trinna Post, PA-C  [facility]

## 2016-08-17 DIAGNOSIS — L237 Allergic contact dermatitis due to plants, except food: Secondary | ICD-10-CM | POA: Diagnosis not present

## 2016-08-20 DIAGNOSIS — R42 Dizziness and giddiness: Secondary | ICD-10-CM | POA: Diagnosis not present

## 2016-08-20 DIAGNOSIS — R32 Unspecified urinary incontinence: Secondary | ICD-10-CM | POA: Diagnosis not present

## 2016-08-20 DIAGNOSIS — K219 Gastro-esophageal reflux disease without esophagitis: Secondary | ICD-10-CM | POA: Diagnosis not present

## 2016-08-20 DIAGNOSIS — E669 Obesity, unspecified: Secondary | ICD-10-CM | POA: Diagnosis not present

## 2016-08-20 DIAGNOSIS — J45909 Unspecified asthma, uncomplicated: Secondary | ICD-10-CM | POA: Diagnosis not present

## 2016-08-20 DIAGNOSIS — R040 Epistaxis: Secondary | ICD-10-CM | POA: Diagnosis not present

## 2016-08-20 DIAGNOSIS — M8580 Other specified disorders of bone density and structure, unspecified site: Secondary | ICD-10-CM | POA: Diagnosis not present

## 2016-08-20 DIAGNOSIS — D500 Iron deficiency anemia secondary to blood loss (chronic): Secondary | ICD-10-CM | POA: Diagnosis not present

## 2016-08-20 DIAGNOSIS — J323 Chronic sphenoidal sinusitis: Secondary | ICD-10-CM | POA: Diagnosis not present

## 2016-08-20 DIAGNOSIS — Z79899 Other long term (current) drug therapy: Secondary | ICD-10-CM | POA: Diagnosis not present

## 2016-08-20 DIAGNOSIS — H3530 Unspecified macular degeneration: Secondary | ICD-10-CM | POA: Diagnosis not present

## 2016-08-20 DIAGNOSIS — R51 Headache: Secondary | ICD-10-CM | POA: Diagnosis not present

## 2016-08-20 DIAGNOSIS — R310 Gross hematuria: Secondary | ICD-10-CM | POA: Diagnosis not present

## 2016-08-20 DIAGNOSIS — R638 Other symptoms and signs concerning food and fluid intake: Secondary | ICD-10-CM | POA: Diagnosis not present

## 2016-08-24 DIAGNOSIS — H2513 Age-related nuclear cataract, bilateral: Secondary | ICD-10-CM | POA: Diagnosis not present

## 2016-08-27 DIAGNOSIS — Z6841 Body Mass Index (BMI) 40.0 and over, adult: Secondary | ICD-10-CM | POA: Diagnosis not present

## 2016-08-27 DIAGNOSIS — G4733 Obstructive sleep apnea (adult) (pediatric): Secondary | ICD-10-CM | POA: Diagnosis not present

## 2016-08-27 DIAGNOSIS — H3530 Unspecified macular degeneration: Secondary | ICD-10-CM | POA: Diagnosis not present

## 2016-08-27 DIAGNOSIS — J45909 Unspecified asthma, uncomplicated: Secondary | ICD-10-CM | POA: Diagnosis not present

## 2016-08-27 DIAGNOSIS — Z85828 Personal history of other malignant neoplasm of skin: Secondary | ICD-10-CM | POA: Diagnosis not present

## 2016-08-27 DIAGNOSIS — Z79899 Other long term (current) drug therapy: Secondary | ICD-10-CM | POA: Diagnosis not present

## 2016-08-27 DIAGNOSIS — D6851 Activated protein C resistance: Secondary | ICD-10-CM | POA: Diagnosis not present

## 2016-08-27 DIAGNOSIS — K219 Gastro-esophageal reflux disease without esophagitis: Secondary | ICD-10-CM | POA: Diagnosis not present

## 2016-08-27 DIAGNOSIS — R1310 Dysphagia, unspecified: Secondary | ICD-10-CM | POA: Diagnosis not present

## 2016-08-27 DIAGNOSIS — F329 Major depressive disorder, single episode, unspecified: Secondary | ICD-10-CM | POA: Diagnosis not present

## 2016-08-27 DIAGNOSIS — Z9989 Dependence on other enabling machines and devices: Secondary | ICD-10-CM | POA: Diagnosis not present

## 2016-08-27 DIAGNOSIS — Z91040 Latex allergy status: Secondary | ICD-10-CM | POA: Diagnosis not present

## 2016-08-31 ENCOUNTER — Encounter: Payer: Self-pay | Admitting: *Deleted

## 2016-09-01 ENCOUNTER — Encounter: Payer: Self-pay | Admitting: Physician Assistant

## 2016-09-04 DIAGNOSIS — I951 Orthostatic hypotension: Secondary | ICD-10-CM | POA: Diagnosis not present

## 2016-09-04 DIAGNOSIS — H9041 Sensorineural hearing loss, unilateral, right ear, with unrestricted hearing on the contralateral side: Secondary | ICD-10-CM | POA: Diagnosis not present

## 2016-09-04 NOTE — Discharge Instructions (Signed)

## 2016-09-07 ENCOUNTER — Ambulatory Visit
Admission: RE | Admit: 2016-09-07 | Discharge: 2016-09-07 | Disposition: A | Discharge status: Home / Self Care | Payer: Medicare Other | Source: Ambulatory Visit | Attending: Ophthalmology | Admitting: Ophthalmology

## 2016-09-07 ENCOUNTER — Ambulatory Visit: Payer: Medicare Other | Admitting: Anesthesiology

## 2016-09-07 ENCOUNTER — Encounter
Admission: RE | Disposition: A | Discharge status: Home / Self Care | Payer: Self-pay | Source: Ambulatory Visit | Attending: Ophthalmology

## 2016-09-07 DIAGNOSIS — G4730 Sleep apnea, unspecified: Secondary | ICD-10-CM | POA: Diagnosis not present

## 2016-09-07 DIAGNOSIS — J45909 Unspecified asthma, uncomplicated: Secondary | ICD-10-CM | POA: Insufficient documentation

## 2016-09-07 DIAGNOSIS — H2512 Age-related nuclear cataract, left eye: Secondary | ICD-10-CM | POA: Diagnosis not present

## 2016-09-07 DIAGNOSIS — Z85828 Personal history of other malignant neoplasm of skin: Secondary | ICD-10-CM | POA: Diagnosis not present

## 2016-09-07 DIAGNOSIS — Z6841 Body Mass Index (BMI) 40.0 and over, adult: Secondary | ICD-10-CM | POA: Insufficient documentation

## 2016-09-07 DIAGNOSIS — K219 Gastro-esophageal reflux disease without esophagitis: Secondary | ICD-10-CM | POA: Insufficient documentation

## 2016-09-07 DIAGNOSIS — F329 Major depressive disorder, single episode, unspecified: Secondary | ICD-10-CM | POA: Diagnosis not present

## 2016-09-07 DIAGNOSIS — H2513 Age-related nuclear cataract, bilateral: Secondary | ICD-10-CM | POA: Diagnosis not present

## 2016-09-07 DIAGNOSIS — Z79899 Other long term (current) drug therapy: Secondary | ICD-10-CM | POA: Insufficient documentation

## 2016-09-07 HISTORY — PX: CATARACT EXTRACTION W/PHACO: SHX586

## 2016-09-07 HISTORY — DX: Presence of dental prosthetic device (complete) (partial): Z97.2

## 2016-09-07 HISTORY — DX: Unspecified osteoarthritis, unspecified site: M19.90

## 2016-09-07 SURGERY — PHACOEMULSIFICATION, CATARACT, WITH IOL INSERTION
Anesthesia: Monitor Anesthesia Care | Laterality: Left | Wound class: Clean

## 2016-09-07 MED ORDER — ARMC OPHTHALMIC DILATING DROPS
1.0000 "application " | OPHTHALMIC | Status: DC | PRN
  Administered 2016-09-07 (×3): 1 via OPHTHALMIC

## 2016-09-07 MED ORDER — SODIUM HYALURONATE 10 MG/ML IO SOLN
INTRAOCULAR | Status: DC | PRN
  Administered 2016-09-07: 0.55 mL via INTRAOCULAR

## 2016-09-07 MED ORDER — MIDAZOLAM HCL 2 MG/2ML IJ SOLN
INTRAMUSCULAR | Status: DC | PRN
  Administered 2016-09-07: 2 mg via INTRAVENOUS

## 2016-09-07 MED ORDER — FENTANYL CITRATE (PF) 100 MCG/2ML IJ SOLN
INTRAMUSCULAR | Status: DC | PRN
  Administered 2016-09-07: 50 ug via INTRAVENOUS

## 2016-09-07 MED ORDER — ACETAMINOPHEN 160 MG/5ML PO SOLN: 325.0000 mg | ORAL | Status: DC | PRN

## 2016-09-07 MED ORDER — LIDOCAINE HCL (PF) 4 % IJ SOLN
INTRAOCULAR | Status: DC | PRN
  Administered 2016-09-07: 1 mL via OPHTHALMIC

## 2016-09-07 MED ORDER — MOXIFLOXACIN HCL 0.5 % OP SOLN
OPHTHALMIC | Status: DC | PRN
  Administered 2016-09-07: 0.2 mL via OPHTHALMIC

## 2016-09-07 MED ORDER — SODIUM HYALURONATE 23 MG/ML IO SOLN
INTRAOCULAR | Status: DC | PRN
  Administered 2016-09-07: .5 mL via INTRAOCULAR

## 2016-09-07 MED ORDER — ACETAMINOPHEN 325 MG PO TABS: 325.0000 mg | ORAL_TABLET | ORAL | Status: DC | PRN

## 2016-09-07 MED ORDER — EPINEPHRINE PF 1 MG/ML IJ SOLN
INTRAOCULAR | Status: DC | PRN
  Administered 2016-09-07: 58 mL via OPHTHALMIC

## 2016-09-07 SURGICAL SUPPLY — 17 items
CANNULA ANT/CHMB 27G (MISCELLANEOUS) ×1 IMPLANT
CANNULA ANT/CHMB 27GA (MISCELLANEOUS) ×2 IMPLANT
DISSECTOR HYDRO NUCLEUS 50X22 (MISCELLANEOUS) ×2 IMPLANT
GLOVE BIO SURGEON STRL SZ8 (GLOVE) ×2 IMPLANT
GLOVE SURG LX 7.5 STRW (GLOVE) ×1
GLOVE SURG LX STRL 7.5 STRW (GLOVE) ×1 IMPLANT
GOWN STRL REUS W/ TWL LRG LVL3 (GOWN DISPOSABLE) ×2 IMPLANT
GOWN STRL REUS W/TWL LRG LVL3 (GOWN DISPOSABLE) ×4
LENS IOL TECNIS ITEC 23.0 (Intraocular Lens) ×1 IMPLANT
MARKER SKIN DUAL TIP RULER LAB (MISCELLANEOUS) ×2 IMPLANT
PACK CATARACT (MISCELLANEOUS) ×2 IMPLANT
PACK DR. KING ARMS (PACKS) ×2 IMPLANT
PACK EYE AFTER SURG (MISCELLANEOUS) ×2 IMPLANT
SYR 3ML LL SCALE MARK (SYRINGE) ×2 IMPLANT
SYR TB 1ML LUER SLIP (SYRINGE) ×2 IMPLANT
WATER STERILE IRR 500ML POUR (IV SOLUTION) ×2 IMPLANT
WIPE NON LINTING 3.25X3.25 (MISCELLANEOUS) ×2 IMPLANT

## 2016-09-07 NOTE — H&P (Signed)
The History and Physical notes are on paper, have been signed, and are to be scanned.   I have examined the patient and there are no changes to the H&P.   [name] 09/07/2016 9:51 AM

## 2016-09-07 NOTE — Anesthesia Postprocedure Evaluation (Signed)
Anesthesia Post Note  Patient: Faith Thompson  Procedure(s) Performed: Procedure(s) (LRB): CATARACT EXTRACTION PHACO AND INTRAOCULAR LENS PLACEMENT (IOC)  Left (Left)  Patient location during evaluation: PACU Anesthesia Type: MAC Level of consciousness: awake and alert and oriented Pain management: satisfactory to patient Vital Signs Assessment: post-procedure vital signs reviewed and stable Respiratory status: spontaneous breathing, nonlabored ventilation and respiratory function stable Cardiovascular status: blood pressure returned to baseline and stable Postop Assessment: Adequate PO intake and No signs of nausea or vomiting Anesthetic complications: no    Raliegh Ip

## 2016-09-07 NOTE — Transfer of Care (Signed)
Immediate Anesthesia Transfer of Care Note  Patient: Faith Thompson  Procedure(s) Performed: Procedure(s) with comments: CATARACT EXTRACTION PHACO AND INTRAOCULAR LENS PLACEMENT (IOC)  Left (Left) - IVA TOPICAL sleep apnea  Patient Location: PACU  Anesthesia Type: MAC  Level of Consciousness: awake, alert  and patient cooperative  Airway and Oxygen Therapy: Patient Spontanous Breathing and Patient connected to supplemental oxygen  Post-op Assessment: Post-op Vital signs reviewed, Patient's Cardiovascular Status Stable, Respiratory Function Stable, Patent Airway and No signs of Nausea or vomiting  Post-op Vital Signs: Reviewed and stable  Complications: No apparent anesthesia complications

## 2016-09-07 NOTE — Anesthesia Procedure Notes (Signed)
Procedure Name: MAC Performed by: Makana Rostad Pre-anesthesia Checklist: Patient identified, Emergency Drugs available, Suction available, Timeout performed and Patient being monitored Patient Re-evaluated:Patient Re-evaluated prior to inductionOxygen Delivery Method: Nasal cannula Placement Confirmation: positive ETCO2     

## 2016-09-07 NOTE — Op Note (Signed)
OPERATIVE NOTE  Faith Thompson [id number] 09/07/2016   PREOPERATIVE DIAGNOSIS:  Nuclear sclerotic cataract left eye.  H25.12   POSTOPERATIVE DIAGNOSIS:    Nuclear sclerotic cataract left eye.     PROCEDURE:  Phacoemusification with posterior chamber intraocular lens placement of the left eye   LENS:   Implant Name Type Inv. Item Serial No. Manufacturer Lot No. LRB No. Used  LENS IOL DIOP 23.0 - [id number] Intraocular Lens LENS IOL DIOP 23.0 [id number] AMO   Left 1       PCB00 +23.0   ULTRASOUND TIME: 0 minutes 22.3 seconds.  CDE 2.98   SURGEON:  [name], MD, MPH   ANESTHESIA:  Topical with tetracaine drops augmented with 1% preservative-free intracameral lidocaine.  ESTIMATED BLOOD LOSS: <1 mL   COMPLICATIONS:  None.   DESCRIPTION OF PROCEDURE:  The patient was identified in the holding room and transported to the operating room and placed in the supine position under the operating microscope.  The left eye was identified as the operative eye and it was prepped and draped in the usual sterile ophthalmic fashion.   A 1.0 millimeter clear-corneal paracentesis was made at the 5:00 position. 0.5 ml of preservative-free 1% lidocaine with epinephrine was injected into the anterior chamber.  The anterior chamber was filled with Healon 5 viscoelastic.  A 2.4 millimeter keratome was used to make a near-clear corneal incision at the 2:00 position.  A curvilinear capsulorrhexis was made with a cystotome and capsulorrhexis forceps.  Balanced salt solution was used to hydrodissect and hydrodelineate the nucleus.   Phacoemulsification was then used in stop and chop fashion to remove the lens nucleus and epinucleus.  The remaining cortex was then removed using the irrigation and aspiration handpiece. Healon was then placed into the capsular bag to distend it for lens placement.  A lens was then injected into the capsular bag.  The remaining viscoelastic was aspirated.   Wounds were hydrated  with balanced salt solution.  The anterior chamber was inflated to a physiologic pressure with balanced salt solution.  Intracameral vigamox 0.1 mL undiltued was injected into the eye and a drop placed onto the ocular surface.  No wound leaks were noted.  The patient was taken to the recovery room in stable condition without complications of anesthesia or surgery  [name] 09/07/2016, 10:28 AM

## 2016-09-07 NOTE — Anesthesia Preprocedure Evaluation (Signed)
Anesthesia Evaluation  Patient identified by MRN, date of birth, ID band Patient awake    Reviewed: Allergy & Precautions, H&P , NPO status , Patient's Chart, lab work & pertinent test results  Airway Mallampati: II  TM Distance: >3 FB Neck ROM: full    Dental no notable dental hx.    Pulmonary asthma , sleep apnea ,    Pulmonary exam normal breath sounds clear to auscultation       Cardiovascular Normal cardiovascular exam Rhythm:regular Rate:Normal     Neuro/Psych PSYCHIATRIC DISORDERS    GI/Hepatic GERD  ,  Endo/Other  Morbid obesity  Renal/GU      Musculoskeletal   Abdominal   Peds  Hematology   Anesthesia Other Findings   Reproductive/Obstetrics                             Anesthesia Physical Anesthesia Plan  ASA: II  Anesthesia Plan: MAC   Post-op Pain Management:    Induction:   PONV Risk Score and Plan: 2 and Midazolam  Airway Management Planned:   Additional Equipment:   Intra-op Plan:   Post-operative Plan:   Informed Consent: I have reviewed the patients History and Physical, chart, labs and discussed the procedure including the risks, benefits and alternatives for the proposed anesthesia with the patient or authorized representative who has indicated his/her understanding and acceptance.     Plan Discussed with: CRNA  Anesthesia Plan Comments:         Anesthesia Quick Evaluation

## 2016-09-08 ENCOUNTER — Encounter: Payer: Self-pay | Admitting: Ophthalmology

## 2016-09-28 DIAGNOSIS — I780 Hereditary hemorrhagic telangiectasia: Secondary | ICD-10-CM | POA: Diagnosis not present

## 2016-10-13 DIAGNOSIS — H2511 Age-related nuclear cataract, right eye: Secondary | ICD-10-CM | POA: Diagnosis not present

## 2016-10-14 ENCOUNTER — Encounter: Payer: Self-pay | Admitting: *Deleted

## 2016-10-19 NOTE — Discharge Instructions (Signed)

## 2016-10-20 ENCOUNTER — Ambulatory Visit
Admission: RE | Admit: 2016-10-20 | Discharge: 2016-10-20 | Disposition: A | Discharge status: Home / Self Care | Payer: Medicare Other | Source: Ambulatory Visit | Attending: Ophthalmology | Admitting: Ophthalmology

## 2016-10-20 ENCOUNTER — Ambulatory Visit: Payer: Medicare Other | Admitting: Anesthesiology

## 2016-10-20 ENCOUNTER — Encounter
Admission: RE | Disposition: A | Discharge status: Home / Self Care | Payer: Self-pay | Source: Ambulatory Visit | Attending: Ophthalmology

## 2016-10-20 DIAGNOSIS — H2511 Age-related nuclear cataract, right eye: Secondary | ICD-10-CM | POA: Diagnosis not present

## 2016-10-20 DIAGNOSIS — G4730 Sleep apnea, unspecified: Secondary | ICD-10-CM | POA: Diagnosis not present

## 2016-10-20 DIAGNOSIS — Z79899 Other long term (current) drug therapy: Secondary | ICD-10-CM | POA: Insufficient documentation

## 2016-10-20 DIAGNOSIS — Z6841 Body Mass Index (BMI) 40.0 and over, adult: Secondary | ICD-10-CM | POA: Insufficient documentation

## 2016-10-20 DIAGNOSIS — F329 Major depressive disorder, single episode, unspecified: Secondary | ICD-10-CM | POA: Insufficient documentation

## 2016-10-20 DIAGNOSIS — K219 Gastro-esophageal reflux disease without esophagitis: Secondary | ICD-10-CM | POA: Insufficient documentation

## 2016-10-20 HISTORY — PX: CATARACT EXTRACTION W/PHACO: SHX586

## 2016-10-20 SURGERY — PHACOEMULSIFICATION, CATARACT, WITH IOL INSERTION
Anesthesia: Monitor Anesthesia Care | Laterality: Right | Wound class: Clean

## 2016-10-20 MED ORDER — MIDAZOLAM HCL 2 MG/2ML IJ SOLN
INTRAMUSCULAR | Status: DC | PRN
  Administered 2016-10-20: 2 mg via INTRAVENOUS

## 2016-10-20 MED ORDER — FENTANYL CITRATE (PF) 100 MCG/2ML IJ SOLN
INTRAMUSCULAR | Status: DC | PRN
  Administered 2016-10-20: 50 ug via INTRAVENOUS

## 2016-10-20 MED ORDER — SODIUM HYALURONATE 23 MG/ML IO SOLN
INTRAOCULAR | Status: DC | PRN
  Administered 2016-10-20: 0.6 mL via INTRAOCULAR

## 2016-10-20 MED ORDER — ARMC OPHTHALMIC DILATING DROPS
1.0000 "application " | OPHTHALMIC | Status: DC | PRN
  Administered 2016-10-20 (×3): 1 via OPHTHALMIC

## 2016-10-20 MED ORDER — LACTATED RINGERS IV SOLN: INTRAVENOUS | Status: DC

## 2016-10-20 MED ORDER — SODIUM HYALURONATE 10 MG/ML IO SOLN
INTRAOCULAR | Status: DC | PRN
  Administered 2016-10-20: 0.55 mL via INTRAOCULAR

## 2016-10-20 MED ORDER — ACETAMINOPHEN 160 MG/5ML PO SOLN: 325.0000 mg | ORAL | Status: DC | PRN

## 2016-10-20 MED ORDER — ACETAMINOPHEN 325 MG PO TABS: 325.0000 mg | ORAL_TABLET | ORAL | Status: DC | PRN

## 2016-10-20 MED ORDER — BSS IO SOLN
INTRAOCULAR | Status: DC | PRN
  Administered 2016-10-20: 68 mL via OPHTHALMIC

## 2016-10-20 MED ORDER — MOXIFLOXACIN HCL 0.5 % OP SOLN
OPHTHALMIC | Status: DC | PRN
  Administered 2016-10-20: 0.2 mL via OPHTHALMIC

## 2016-10-20 MED ORDER — ARMC OPHTHALMIC DILATING DROPS: 1.0000 "application " | OPHTHALMIC | Status: AC

## 2016-10-20 MED ORDER — BALANCED SALT IO SOLN
INTRAOCULAR | Status: DC | PRN
  Administered 2016-10-20: 1 mL via INTRAOCULAR

## 2016-10-20 SURGICAL SUPPLY — 17 items
CANNULA ANT/CHMB 27G (MISCELLANEOUS) ×1 IMPLANT
CANNULA ANT/CHMB 27GA (MISCELLANEOUS) ×2 IMPLANT
DISSECTOR HYDRO NUCLEUS 50X22 (MISCELLANEOUS) ×2 IMPLANT
GLOVE BIO SURGEON STRL SZ8 (GLOVE) ×2 IMPLANT
GLOVE SURG LX 7.5 STRW (GLOVE) ×1
GLOVE SURG LX STRL 7.5 STRW (GLOVE) ×1 IMPLANT
GOWN STRL REUS W/ TWL LRG LVL3 (GOWN DISPOSABLE) ×2 IMPLANT
GOWN STRL REUS W/TWL LRG LVL3 (GOWN DISPOSABLE) ×4
LENS IOL TECNIS ITEC 23.5 (Intraocular Lens) ×1 IMPLANT
MARKER SKIN DUAL TIP RULER LAB (MISCELLANEOUS) ×2 IMPLANT
PACK CATARACT (MISCELLANEOUS) ×2 IMPLANT
PACK DR. KING ARMS (PACKS) ×2 IMPLANT
PACK EYE AFTER SURG (MISCELLANEOUS) ×2 IMPLANT
SYR 3ML LL SCALE MARK (SYRINGE) ×2 IMPLANT
SYR TB 1ML LUER SLIP (SYRINGE) ×2 IMPLANT
WATER STERILE IRR 500ML POUR (IV SOLUTION) ×2 IMPLANT
WIPE NON LINTING 3.25X3.25 (MISCELLANEOUS) ×2 IMPLANT

## 2016-10-20 NOTE — H&P (Signed)
The History and Physical notes are on paper, have been signed, and are to be scanned.   I have examined the patient and there are no changes to the H&P.   [name] 10/20/2016 8:45 AM

## 2016-10-20 NOTE — Anesthesia Procedure Notes (Signed)
Procedure Name: MAC Performed by: Baird Polinski Pre-anesthesia Checklist: Patient identified, Emergency Drugs available, Suction available, Timeout performed and Patient being monitored Patient Re-evaluated:Patient Re-evaluated prior to inductionOxygen Delivery Method: Nasal cannula Placement Confirmation: positive ETCO2     

## 2016-10-20 NOTE — Op Note (Signed)
OPERATIVE NOTE  Faith Thompson [id number] 10/20/2016   PREOPERATIVE DIAGNOSIS:  Nuclear sclerotic cataract right eye.  H25.11   POSTOPERATIVE DIAGNOSIS:    Nuclear sclerotic cataract right eye.     PROCEDURE:  Phacoemusification with posterior chamber intraocular lens placement of the right eye   LENS:   Implant Name Type Inv. Item Serial No. Manufacturer Lot No. LRB No. Used  LENS IOL DIOP 23.5 - [id number] Intraocular Lens LENS IOL DIOP 23.5 [id number] AMO   Right 1       PCB00 +23.5   ULTRASOUND TIME: 0 minutes 26.9 seconds.  CDE 3.62   SURGEON:  [name], MD, MPH  ANESTHESIOLOGIST: Anesthesiologist: Ronelle Nigh, MD CRNA: Londell Moh, CRNA   ANESTHESIA:  Topical with tetracaine drops augmented with 1% preservative-free intracameral lidocaine.  ESTIMATED BLOOD LOSS: less than 1 mL.   COMPLICATIONS:  None.   DESCRIPTION OF PROCEDURE:  The patient was identified in the holding room and transported to the operating room and placed in the supine position under the operating microscope.  The right eye was identified as the operative eye and it was prepped and draped in the usual sterile ophthalmic fashion.   A 1.0 millimeter clear-corneal paracentesis was made at the 10:30 position. 0.5 ml of preservative-free 1% lidocaine with epinephrine was injected into the anterior chamber.  The anterior chamber was filled with Healon 5 viscoelastic.  A 2.4 millimeter keratome was used to make a near-clear corneal incision at the 8:00 position.  A curvilinear capsulorrhexis was made with a cystotome and capsulorrhexis forceps.  Balanced salt solution was used to hydrodissect and hydrodelineate the nucleus.   Phacoemulsification was then used in stop and chop fashion to remove the lens nucleus and epinucleus.  The remaining cortex was then removed using the irrigation and aspiration handpiece. Healon was then placed into the capsular bag to distend it for lens placement.  A lens was then  injected into the capsular bag.  The remaining viscoelastic was aspirated.   Wounds were hydrated with balanced salt solution.  The anterior chamber was inflated to a physiologic pressure with balanced salt solution.   Intracameral vigamox 0.1 mL undiluted was injected into the eye and a drop placed onto the ocular surface.  No wound leaks were noted.  The patient was taken to the recovery room in stable condition without complications of anesthesia or surgery  [name] 10/20/2016, 9:18 AM

## 2016-10-20 NOTE — Anesthesia Preprocedure Evaluation (Signed)
Anesthesia Evaluation  Patient identified by MRN, date of birth, ID band Patient awake    Reviewed: Allergy & Precautions, H&P , NPO status , Patient's Chart, lab work & pertinent test results  Airway Mallampati: II  TM Distance: >3 FB Neck ROM: full    Dental no notable dental hx.    Pulmonary asthma , sleep apnea ,    Pulmonary exam normal breath sounds clear to auscultation       Cardiovascular Normal cardiovascular exam Rhythm:regular Rate:Normal     Neuro/Psych PSYCHIATRIC DISORDERS    GI/Hepatic GERD  ,  Endo/Other  Morbid obesity  Renal/GU      Musculoskeletal   Abdominal   Peds  Hematology   Anesthesia Other Findings   Reproductive/Obstetrics                             Anesthesia Physical  Anesthesia Plan  ASA: II  Anesthesia Plan: MAC   Post-op Pain Management:    Induction:   PONV Risk Score and Plan: 2 and Midazolam  Airway Management Planned:   Additional Equipment:   Intra-op Plan:   Post-operative Plan:   Informed Consent: I have reviewed the patients History and Physical, chart, labs and discussed the procedure including the risks, benefits and alternatives for the proposed anesthesia with the patient or authorized representative who has indicated his/her understanding and acceptance.     Plan Discussed with: CRNA  Anesthesia Plan Comments:         Anesthesia Quick Evaluation

## 2016-10-20 NOTE — Transfer of Care (Signed)
Immediate Anesthesia Transfer of Care Note  Patient: Faith Thompson  Procedure(s) Performed: Procedure(s) with comments: CATARACT EXTRACTION PHACO AND INTRAOCULAR LENS PLACEMENT (IOC)  RIGHT (Right) - sleep apnea  Patient Location: PACU  Anesthesia Type: MAC  Level of Consciousness: awake, alert  and patient cooperative  Airway and Oxygen Therapy: Patient Spontanous Breathing and Patient connected to supplemental oxygen  Post-op Assessment: Post-op Vital signs reviewed, Patient's Cardiovascular Status Stable, Respiratory Function Stable, Patent Airway and No signs of Nausea or vomiting  Post-op Vital Signs: Reviewed and stable  Complications: No apparent anesthesia complications

## 2016-10-20 NOTE — Anesthesia Postprocedure Evaluation (Signed)
Anesthesia Post Note  Patient: Faith Thompson  Procedure(s) Performed: Procedure(s) (LRB): CATARACT EXTRACTION PHACO AND INTRAOCULAR LENS PLACEMENT (IOC)  RIGHT (Right)  Patient location during evaluation: PACU Anesthesia Type: MAC Level of consciousness: awake and alert and oriented Pain management: satisfactory to patient Vital Signs Assessment: post-procedure vital signs reviewed and stable Respiratory status: spontaneous breathing, nonlabored ventilation and respiratory function stable Cardiovascular status: blood pressure returned to baseline and stable Postop Assessment: Adequate PO intake and No signs of nausea or vomiting Anesthetic complications: no    Raliegh Ip

## 2016-10-21 ENCOUNTER — Encounter: Payer: Self-pay | Admitting: Ophthalmology

## 2016-10-23 ENCOUNTER — Telehealth: Payer: Self-pay

## 2016-10-23 NOTE — Telephone Encounter (Signed)
Called [employer]are [place] and spoke with [facility] and she stated that since the claim doesn't belong to the [facility] claim that [facility] needs to call or the patient to call the 1-800 medicare number.She reports that if the patient is questioning anything regarding the coverage that a three way call can be done with patient,pcp,and [facility].

## 2016-10-29 DIAGNOSIS — Z0000 Encounter for general adult medical examination without abnormal findings: Secondary | ICD-10-CM | POA: Diagnosis not present

## 2016-11-04 ENCOUNTER — Ambulatory Visit: Payer: Medicare Other

## 2016-11-12 ENCOUNTER — Telehealth: Payer: Self-pay

## 2016-11-12 NOTE — Telephone Encounter (Signed)
LM for pt to CB and r/s cancelled AWV from 11/04/16. Pt cancelled this appointment.

## 2016-11-28 ENCOUNTER — Other Ambulatory Visit: Payer: Self-pay | Admitting: Physician Assistant

## 2016-11-28 DIAGNOSIS — F3342 Major depressive disorder, recurrent, in full remission: Secondary | ICD-10-CM

## 2016-12-10 DIAGNOSIS — Z23 Encounter for immunization: Secondary | ICD-10-CM | POA: Diagnosis not present

## 2016-12-24 ENCOUNTER — Telehealth: Payer: Self-pay

## 2016-12-24 NOTE — Telephone Encounter (Signed)
LMTCB and r/s missed AWV. -MM

## 2016-12-31 DIAGNOSIS — I780 Hereditary hemorrhagic telangiectasia: Secondary | ICD-10-CM | POA: Diagnosis not present

## 2016-12-31 DIAGNOSIS — D6851 Activated protein C resistance: Secondary | ICD-10-CM | POA: Diagnosis not present

## 2016-12-31 DIAGNOSIS — Q223 Other congenital malformations of pulmonary valve: Secondary | ICD-10-CM | POA: Diagnosis not present

## 2016-12-31 DIAGNOSIS — Z9989 Dependence on other enabling machines and devices: Secondary | ICD-10-CM | POA: Diagnosis not present

## 2016-12-31 DIAGNOSIS — G4733 Obstructive sleep apnea (adult) (pediatric): Secondary | ICD-10-CM | POA: Diagnosis not present

## 2016-12-31 DIAGNOSIS — Q2733 Arteriovenous malformation of digestive system vessel: Secondary | ICD-10-CM | POA: Diagnosis not present

## 2016-12-31 DIAGNOSIS — R040 Epistaxis: Secondary | ICD-10-CM | POA: Diagnosis not present

## 2016-12-31 DIAGNOSIS — M1990 Unspecified osteoarthritis, unspecified site: Secondary | ICD-10-CM | POA: Diagnosis not present

## 2016-12-31 DIAGNOSIS — K922 Gastrointestinal hemorrhage, unspecified: Secondary | ICD-10-CM | POA: Diagnosis not present

## 2016-12-31 DIAGNOSIS — Z79899 Other long term (current) drug therapy: Secondary | ICD-10-CM | POA: Diagnosis not present

## 2016-12-31 DIAGNOSIS — F329 Major depressive disorder, single episode, unspecified: Secondary | ICD-10-CM | POA: Diagnosis not present

## 2016-12-31 DIAGNOSIS — D500 Iron deficiency anemia secondary to blood loss (chronic): Secondary | ICD-10-CM | POA: Diagnosis not present

## 2016-12-31 DIAGNOSIS — J45909 Unspecified asthma, uncomplicated: Secondary | ICD-10-CM | POA: Diagnosis not present

## 2017-02-25 ENCOUNTER — Telehealth: Payer: Self-pay | Admitting: Physician Assistant

## 2017-02-25 ENCOUNTER — Other Ambulatory Visit: Payer: Self-pay | Admitting: Physician Assistant

## 2017-02-25 DIAGNOSIS — F3342 Major depressive disorder, recurrent, in full remission: Secondary | ICD-10-CM

## 2017-02-25 NOTE — Telephone Encounter (Signed)
Pt is requesting refill on sertraline (ZOLOFT) 50 MG tablet.She is completely out.She would like this sent in to [place]

## 2017-03-14 IMAGING — CR DG CHEST 2V
1 series · 2 of 2 positions shown · non-contrast
Comparison: PA and lateral chest x-ray August 26, 2015

CLINICAL DATA: Cough and chills for the past 2 weeks. History of
asthma and previous episodes of pneumonia. Nonsmoker.

EXAM:
CHEST  2 VIEW

[Series 1: dg chest 2 view · 0.14mm/px · 2 of 2 slices shown]
[im 1/2]
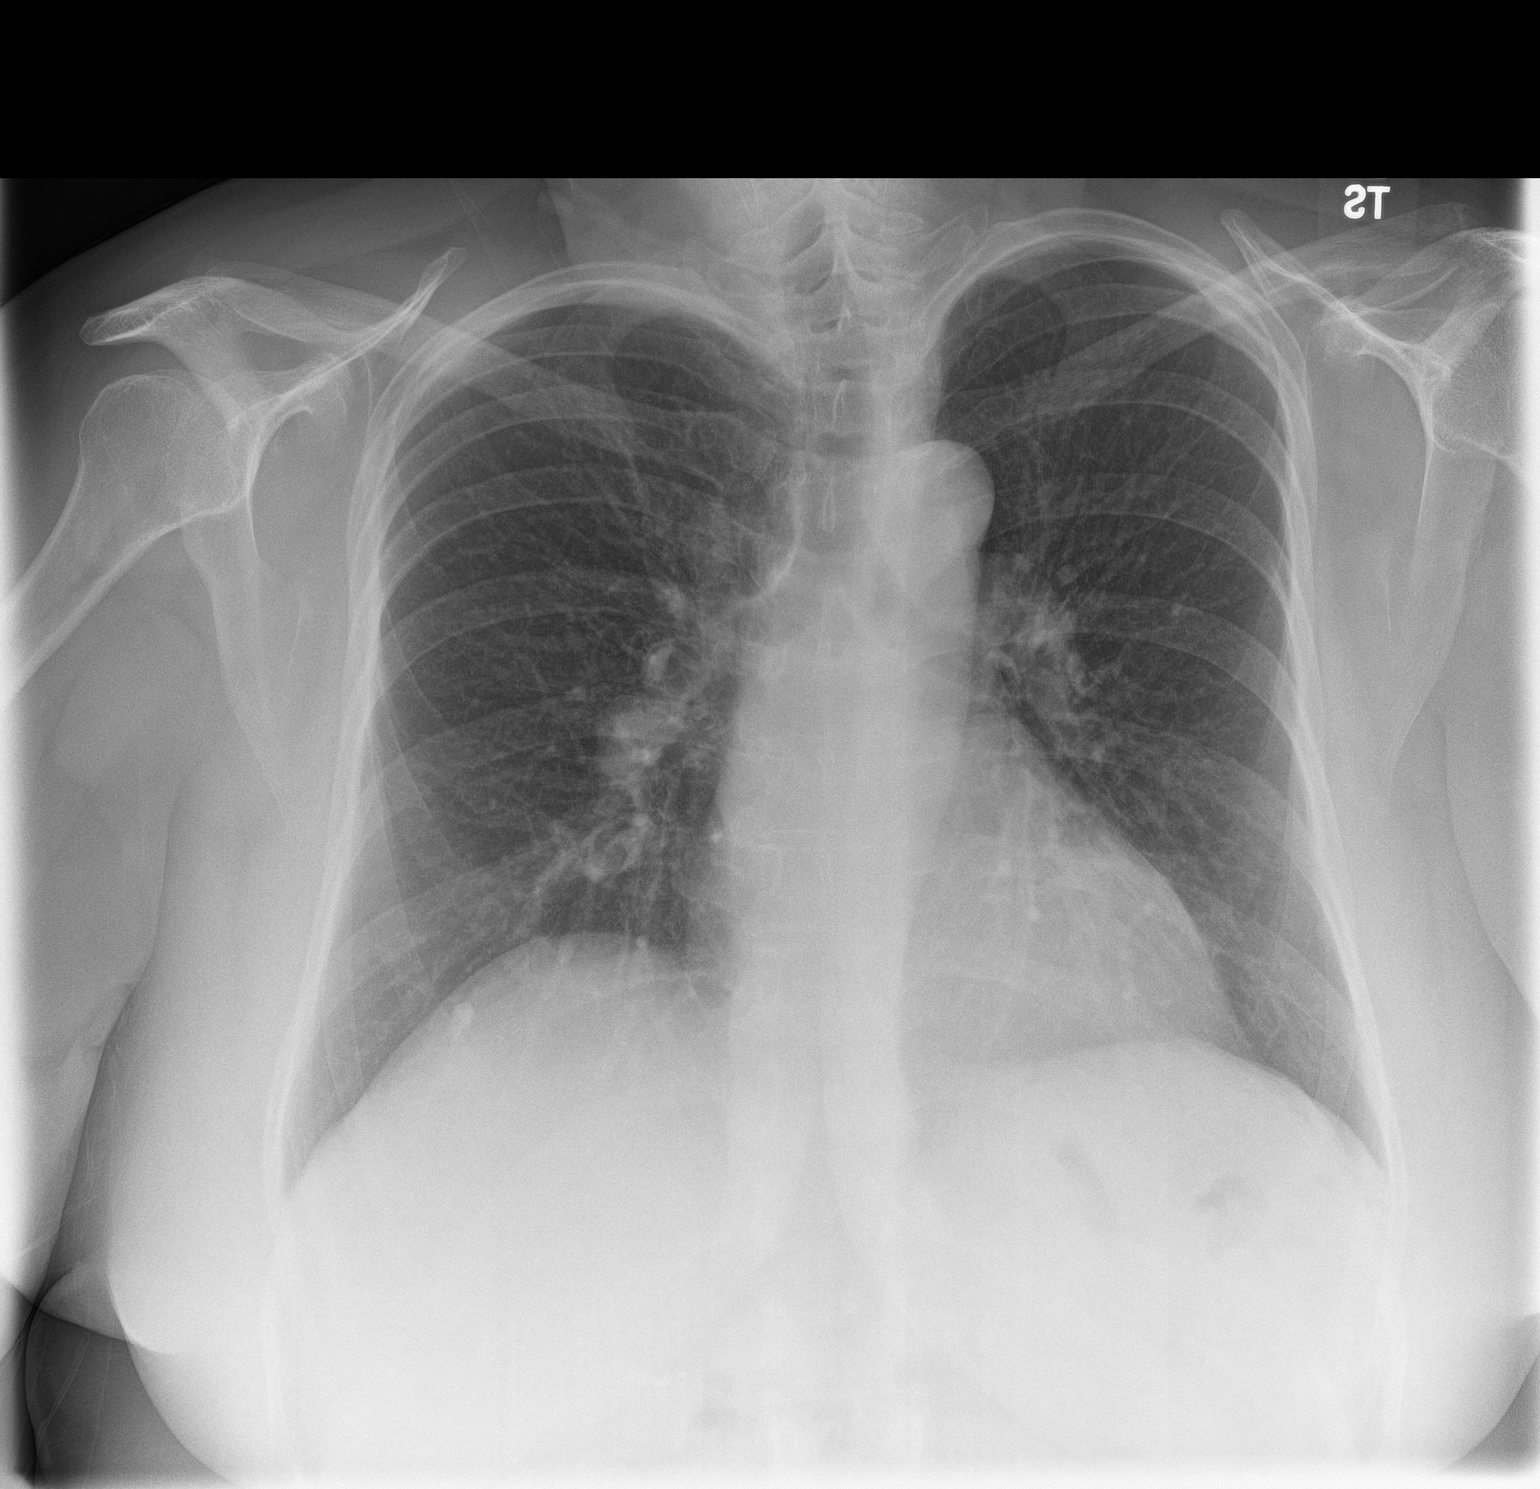
[im 2/2]
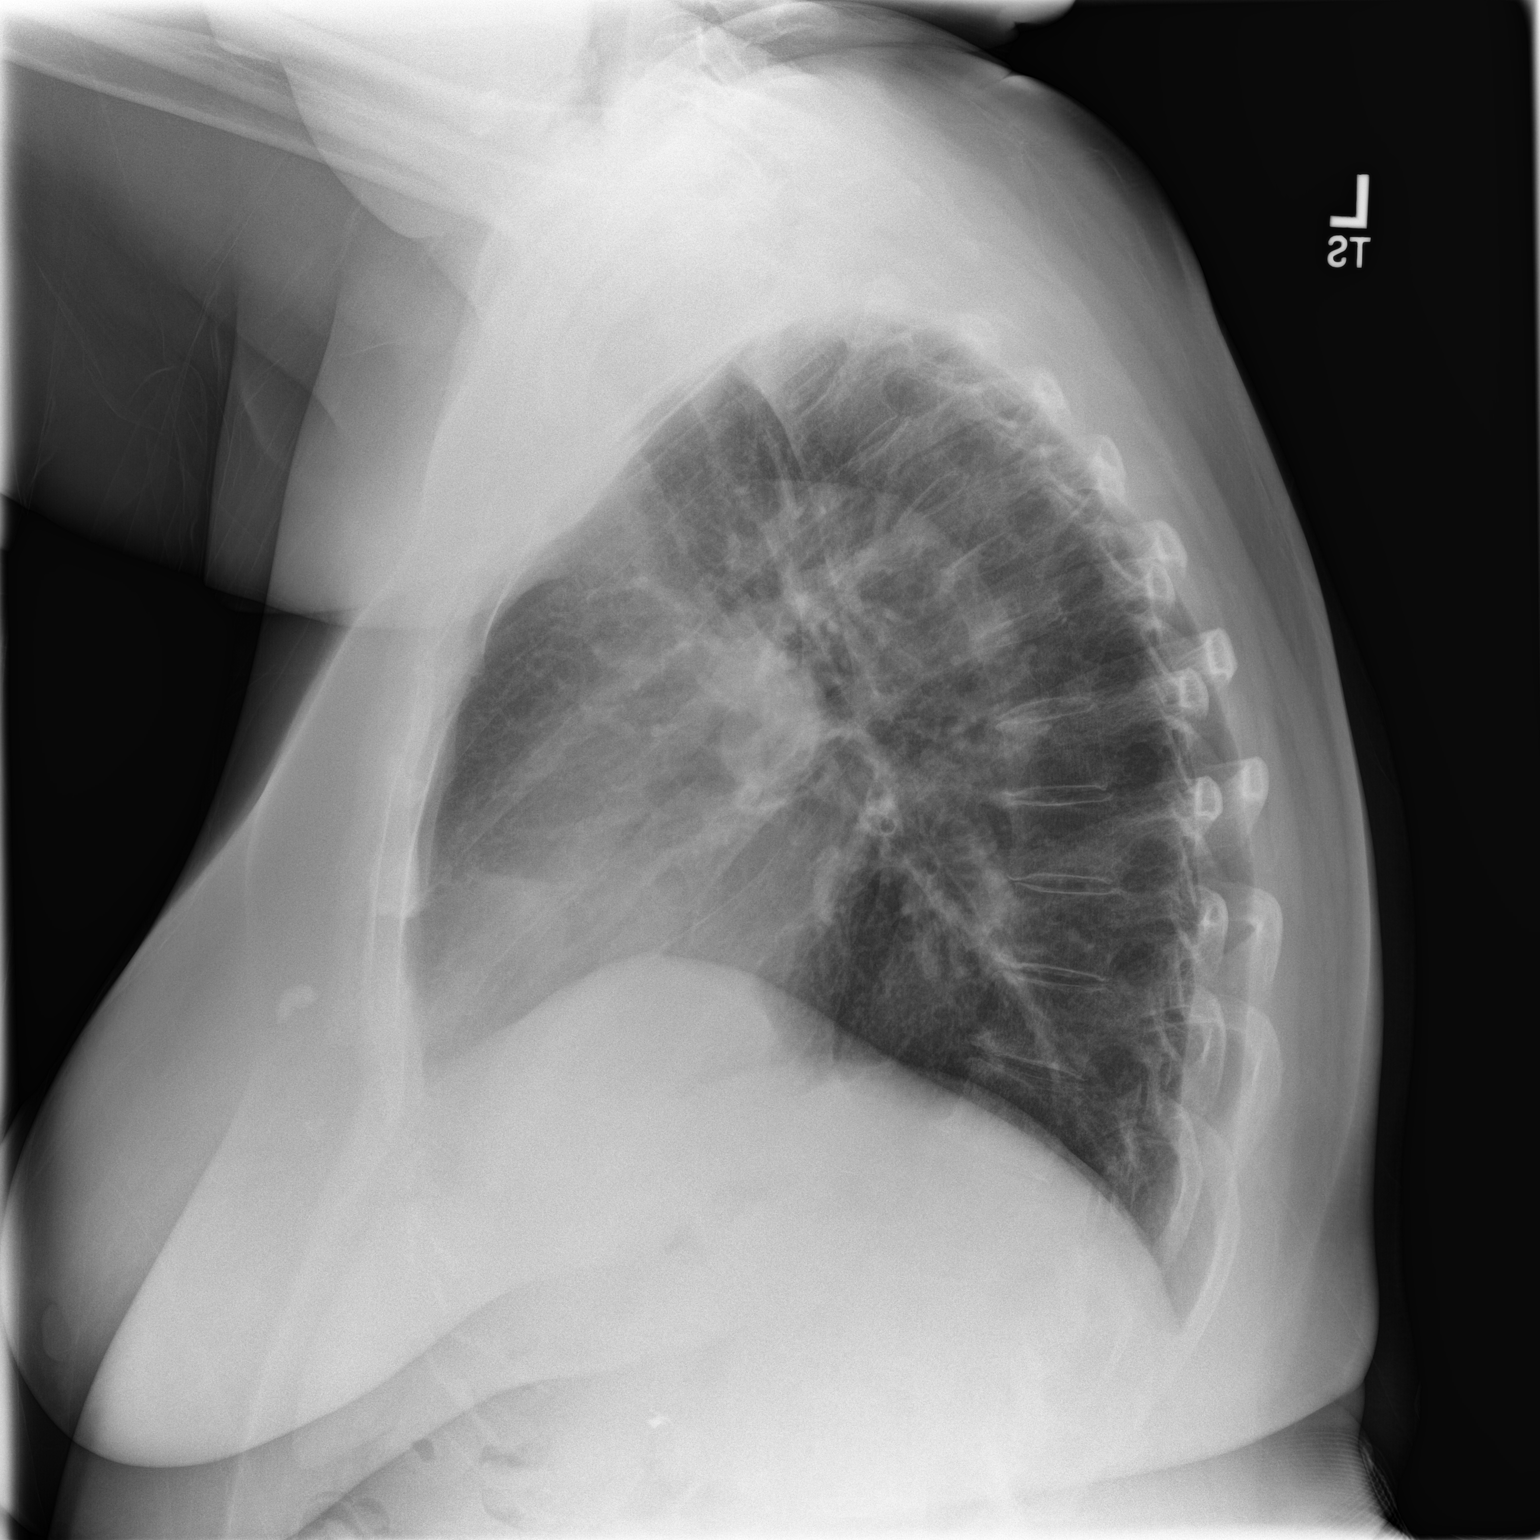

[2 of 2 positions shown; findings below may reference images not displayed]

FINDINGS: The lungs are adequately inflated. There is subtle increased density
in the lingula. There is no alveolar infiltrate. There is no pleural
effusion. The heart and pulmonary vascularity are normal. The
observed bony thorax is unremarkable.
IMPRESSION: Mild increased density in the lingula may reflect atelectasis or
early pneumonia. Followup PA and lateral chest X-ray is recommended
in 3-4 weeks following trial of antibiotic therapy to ensure
resolution and exclude underlying malignancy.

## 2017-04-15 DIAGNOSIS — K219 Gastro-esophageal reflux disease without esophagitis: Secondary | ICD-10-CM | POA: Diagnosis not present

## 2017-04-15 DIAGNOSIS — G4733 Obstructive sleep apnea (adult) (pediatric): Secondary | ICD-10-CM | POA: Diagnosis not present

## 2017-04-15 DIAGNOSIS — J45909 Unspecified asthma, uncomplicated: Secondary | ICD-10-CM | POA: Diagnosis not present

## 2017-04-15 DIAGNOSIS — I780 Hereditary hemorrhagic telangiectasia: Secondary | ICD-10-CM | POA: Diagnosis not present

## 2017-04-15 DIAGNOSIS — K5730 Diverticulosis of large intestine without perforation or abscess without bleeding: Secondary | ICD-10-CM | POA: Diagnosis not present

## 2017-04-15 DIAGNOSIS — Z9049 Acquired absence of other specified parts of digestive tract: Secondary | ICD-10-CM | POA: Diagnosis not present

## 2017-04-15 DIAGNOSIS — K648 Other hemorrhoids: Secondary | ICD-10-CM | POA: Diagnosis not present

## 2017-04-15 DIAGNOSIS — Z86010 Personal history of colonic polyps: Secondary | ICD-10-CM | POA: Diagnosis not present

## 2017-04-15 DIAGNOSIS — Z1211 Encounter for screening for malignant neoplasm of colon: Secondary | ICD-10-CM | POA: Diagnosis not present

## 2017-04-15 DIAGNOSIS — D6851 Activated protein C resistance: Secondary | ICD-10-CM | POA: Diagnosis not present

## 2017-05-21 ENCOUNTER — Ambulatory Visit (INDEPENDENT_AMBULATORY_CARE_PROVIDER_SITE_OTHER): Payer: Medicare Other | Admitting: Physician Assistant

## 2017-05-21 ENCOUNTER — Encounter: Payer: Self-pay | Admitting: Physician Assistant

## 2017-05-21 VITALS — BP 140/80 | HR 83 | Temp 98.8°F | Resp 16 | Wt 259.4 lb

## 2017-05-21 DIAGNOSIS — T3695XA Adverse effect of unspecified systemic antibiotic, initial encounter: Secondary | ICD-10-CM

## 2017-05-21 DIAGNOSIS — J069 Acute upper respiratory infection, unspecified: Secondary | ICD-10-CM

## 2017-05-21 DIAGNOSIS — R05 Cough: Secondary | ICD-10-CM | POA: Diagnosis not present

## 2017-05-21 DIAGNOSIS — B379 Candidiasis, unspecified: Secondary | ICD-10-CM

## 2017-05-21 DIAGNOSIS — R059 Cough, unspecified: Secondary | ICD-10-CM

## 2017-05-21 MED ORDER — HYDROCODONE-HOMATROPINE 5-1.5 MG/5ML PO SYRP: 5.0000 mL | ORAL_SOLUTION | Freq: Three times a day (TID) | ORAL | 0 refills | Status: DC | PRN

## 2017-05-21 MED ORDER — FLUCONAZOLE 150 MG PO TABS: 150.0000 mg | ORAL_TABLET | Freq: Once | ORAL | 0 refills | Status: AC

## 2017-05-21 MED ORDER — AMOXICILLIN-POT CLAVULANATE 875-125 MG PO TABS: 1.0000 | ORAL_TABLET | Freq: Two times a day (BID) | ORAL | 0 refills | Status: DC

## 2017-05-21 NOTE — Progress Notes (Signed)
Patient: Faith Thompson Female    DOB: Nov 06, 1945   72 y.o.   MRN: [id number] Visit Date: 05/21/2017  Today's Provider: Mar Daring, PA-C   Chief Complaint  Patient presents with  . Cough   Subjective:    Cough  This is a new problem. The current episode started in the past 7 days (On Monday). The problem has been unchanged. The problem occurs every few hours. The cough is non-productive. Associated symptoms include headaches and a sore throat (off and on). Pertinent negatives include no chest pain, chills, ear congestion, ear pain, fever, heartburn, nasal congestion, postnasal drip, rhinorrhea, shortness of breath or wheezing. Nothing aggravates the symptoms. Treatments tried: Delsym and takes Zyrtec. Tylenol. The treatment provided no relief. Her past medical history is significant for bronchitis and pneumonia.       Allergies  Allergen Reactions  . Nsaids Other (See Comments)    GI Bleed, AVMs  . Codeine Itching  . 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 Adhesive [Tape] Rash    Any tape - paper, bandaid     Current Outpatient Medications:  .  aluminum hydroxide-magnesium carbonate (GAVISCON) 95-358 MG/15ML SUSP, Take by mouth daily., Disp: , Rfl:  .  calcium carbonate (OS-CAL) 600 MG TABS, Take 600 mg by mouth 2 (two) times daily with a meal., Disp: , Rfl:  .  cetirizine (ZYRTEC) 10 MG tablet, Take 10 mg by mouth daily., Disp: , Rfl:  .  ferrous sulfate 325 (65 FE) MG tablet, Take 325 mg by mouth daily with breakfast., Disp: , Rfl:  .  Lutein 6 MG CAPS, Take by mouth daily., Disp: , Rfl:  .  Multiple Vitamins-Minerals (ICAPS AREDS 2 PO), Take by mouth 2 (two) times daily., Disp: , Rfl:  .  omeprazole (PRILOSEC) 20 MG capsule, Take 2 capsules (40 mg total) by mouth 2 (two) times daily before a meal., Disp: 90 capsule, Rfl: 3 .  ranitidine (ZANTAC) 150 MG tablet, Take 150 mg by mouth 2 (two) times daily., Disp: , Rfl:  .  sertraline (ZOLOFT)  50 MG tablet, TAKE ONE TABLET BY MOUTH EVERY DAY, Disp: 90 tablet, Rfl: 1 .  triamcinolone cream (KENALOG) 0.1 %, Apply 1 application topically 2 (two) times daily., Disp: 30 g, Rfl: 0  Review of Systems  Constitutional: Negative for chills and fever.  HENT: Positive for sore throat (off and on). Negative for congestion, ear pain, postnasal drip, rhinorrhea, sinus pressure, sinus pain, sneezing and trouble swallowing.   Respiratory: Positive for cough. Negative for chest tightness, shortness of breath and wheezing.   Cardiovascular: Negative for chest pain, palpitations and leg swelling.  Gastrointestinal: Negative for heartburn.  Neurological: Positive for headaches.    Social History   Tobacco Use  . Smoking status: Never Smoker  . Smokeless tobacco: Never Used  Substance Use Topics  . Alcohol use: Yes    Alcohol/week: 0.0 oz    Comment: occasional very rarely social events   Objective:   BP 140/80 (BP Location: Left Wrist, Patient Position: Sitting, Cuff Size: Normal)   Pulse 83   Temp 98.8 F (37.1 C) (Oral)   Resp 16   Wt 259 lb 6.4 oz (117.7 kg)   SpO2 97%   BMI 49.01 kg/m    Physical Exam  Constitutional: She appears well-developed and well-nourished. No distress.  HENT:  Head: Normocephalic and atraumatic.  Right Ear: Hearing, tympanic membrane, external ear  and ear canal normal.  Left Ear: Hearing, tympanic membrane, external ear and ear canal normal.  Nose: Nose normal.  Mouth/Throat: Uvula is midline, oropharynx is clear and moist and mucous membranes are normal. No oropharyngeal exudate.  Eyes: Pupils are equal, round, and reactive to light. Conjunctivae are normal. Right eye exhibits no discharge. Left eye exhibits no discharge. No scleral icterus.  Neck: Normal range of motion. Neck supple. No tracheal deviation present. No thyromegaly present.  Cardiovascular: Normal rate, regular rhythm and normal heart sounds. Exam reveals no gallop and no friction rub.    No murmur heard. Pulmonary/Chest: Effort normal and breath sounds normal. No stridor. No respiratory distress. She has no wheezes. She has no rales.  Lymphadenopathy:    She has no cervical adenopathy.  Skin: Skin is warm and dry. She is not diaphoretic.  Vitals reviewed.       Assessment & Plan:     1. Upper respiratory tract infection, unspecified type Worsening symptoms that have not responded to OTC medications. Will give augmentin as below. Continue allergy medications. Stay well hydrated and get plenty of rest. Call if no symptom improvement or if symptoms worsen. - amoxicillin-clavulanate (AUGMENTIN) 875-125 MG tablet; Take 1 tablet by mouth 2 (two) times daily.  Dispense: 20 tablet; Refill: 0  2. Cough Worsening symptoms that has not responded to OTC medications. Will give Hycodan cough syrup as below for nighttime cough. Drowsiness precautions given to patient. Stay well hydrated. Use delsym, robitussin OR mucinex for daytime cough. - HYDROcodone-homatropine (HYCODAN) 5-1.5 MG/5ML syrup; Take 5 mLs by mouth every 8 (eight) hours as needed for cough.  Dispense: 120 mL; Refill: 0  3. Antibiotic-induced yeast infection  - fluconazole (DIFLUCAN) 150 MG tablet; Take 1 tablet (150 mg total) by mouth once for 1 dose. May repeat with 2nd tab in 48-72 hrs if needed  Dispense: 2 tablet; Refill: 0       Mar Daring, PA-C  [facility]

## 2017-05-31 DIAGNOSIS — H353190 Nonexudative age-related macular degeneration, unspecified eye, stage unspecified: Secondary | ICD-10-CM | POA: Diagnosis not present

## 2017-05-31 DIAGNOSIS — H353132 Nonexudative age-related macular degeneration, bilateral, intermediate dry stage: Secondary | ICD-10-CM | POA: Diagnosis not present

## 2017-06-17 DIAGNOSIS — N3946 Mixed incontinence: Secondary | ICD-10-CM | POA: Diagnosis not present

## 2017-07-20 ENCOUNTER — Encounter: Payer: Self-pay | Admitting: Family Medicine

## 2017-07-29 DIAGNOSIS — K259 Gastric ulcer, unspecified as acute or chronic, without hemorrhage or perforation: Secondary | ICD-10-CM | POA: Diagnosis not present

## 2017-07-29 DIAGNOSIS — Q2733 Arteriovenous malformation of digestive system vessel: Secondary | ICD-10-CM | POA: Diagnosis not present

## 2017-07-29 DIAGNOSIS — Z79899 Other long term (current) drug therapy: Secondary | ICD-10-CM | POA: Diagnosis not present

## 2017-07-29 DIAGNOSIS — F329 Major depressive disorder, single episode, unspecified: Secondary | ICD-10-CM | POA: Diagnosis not present

## 2017-07-29 DIAGNOSIS — H3530 Unspecified macular degeneration: Secondary | ICD-10-CM | POA: Diagnosis not present

## 2017-07-29 DIAGNOSIS — I82409 Acute embolism and thrombosis of unspecified deep veins of unspecified lower extremity: Secondary | ICD-10-CM | POA: Diagnosis not present

## 2017-07-29 DIAGNOSIS — D6851 Activated protein C resistance: Secondary | ICD-10-CM | POA: Diagnosis not present

## 2017-07-29 DIAGNOSIS — J45909 Unspecified asthma, uncomplicated: Secondary | ICD-10-CM | POA: Diagnosis not present

## 2017-07-29 DIAGNOSIS — G4733 Obstructive sleep apnea (adult) (pediatric): Secondary | ICD-10-CM | POA: Diagnosis not present

## 2017-07-29 DIAGNOSIS — D500 Iron deficiency anemia secondary to blood loss (chronic): Secondary | ICD-10-CM | POA: Diagnosis not present

## 2017-07-29 DIAGNOSIS — I780 Hereditary hemorrhagic telangiectasia: Secondary | ICD-10-CM | POA: Diagnosis not present

## 2017-07-29 DIAGNOSIS — R040 Epistaxis: Secondary | ICD-10-CM | POA: Diagnosis not present

## 2017-07-29 DIAGNOSIS — K920 Hematemesis: Secondary | ICD-10-CM | POA: Diagnosis not present

## 2017-07-29 DIAGNOSIS — R5383 Other fatigue: Secondary | ICD-10-CM | POA: Diagnosis not present

## 2017-07-29 DIAGNOSIS — K6389 Other specified diseases of intestine: Secondary | ICD-10-CM | POA: Diagnosis not present

## 2017-07-29 DIAGNOSIS — D509 Iron deficiency anemia, unspecified: Secondary | ICD-10-CM | POA: Diagnosis not present

## 2017-07-29 DIAGNOSIS — K5520 Angiodysplasia of colon without hemorrhage: Secondary | ICD-10-CM | POA: Diagnosis not present

## 2017-08-11 DIAGNOSIS — H6123 Impacted cerumen, bilateral: Secondary | ICD-10-CM | POA: Diagnosis not present

## 2017-08-11 DIAGNOSIS — H9201 Otalgia, right ear: Secondary | ICD-10-CM | POA: Diagnosis not present

## 2017-10-07 ENCOUNTER — Telehealth: Payer: Self-pay | Admitting: Physician Assistant

## 2017-10-07 NOTE — Telephone Encounter (Signed)
I left a message asking the pt to call me at [phone] to schedule AWV w/ NHA McKenzie after 10/29/17. VDM (DD)

## 2017-10-13 DIAGNOSIS — I780 Hereditary hemorrhagic telangiectasia: Secondary | ICD-10-CM | POA: Diagnosis not present

## 2017-10-21 ENCOUNTER — Other Ambulatory Visit: Payer: Self-pay | Admitting: Physician Assistant

## 2017-10-21 DIAGNOSIS — K31819 Angiodysplasia of stomach and duodenum without bleeding: Secondary | ICD-10-CM

## 2017-10-21 NOTE — Telephone Encounter (Signed)
Pt contacted office for refill request on the following medications:  omeprazole (PRILOSEC) 20 MG capsule  [facility]  Please advise. Thanks TNP

## 2017-11-01 ENCOUNTER — Ambulatory Visit: Payer: Medicare Other

## 2017-11-02 ENCOUNTER — Telehealth: Payer: Self-pay

## 2017-11-02 NOTE — Telephone Encounter (Signed)
Called pt to r/s missed AWV from 11/01/17. Pt declines scheduling at this time and states she is going out of town next week. Ok to [facility] in October to schedule AWV. -MM

## 2017-11-17 DIAGNOSIS — Z23 Encounter for immunization: Secondary | ICD-10-CM | POA: Diagnosis not present

## 2017-11-17 DIAGNOSIS — Z1231 Encounter for screening mammogram for malignant neoplasm of breast: Secondary | ICD-10-CM | POA: Diagnosis not present

## 2017-11-18 ENCOUNTER — Other Ambulatory Visit: Payer: Self-pay | Admitting: Internal Medicine

## 2017-11-18 DIAGNOSIS — Z1231 Encounter for screening mammogram for malignant neoplasm of breast: Secondary | ICD-10-CM

## 2017-11-30 DIAGNOSIS — H353132 Nonexudative age-related macular degeneration, bilateral, intermediate dry stage: Secondary | ICD-10-CM | POA: Diagnosis not present

## 2017-12-03 NOTE — Telephone Encounter (Signed)
This encounter was created in error - please disregard.

## 2017-12-03 NOTE — Telephone Encounter (Signed)
LMTCB on # listed on file.  -MM

## 2017-12-07 ENCOUNTER — Ambulatory Visit
Admission: RE | Admit: 2017-12-07 | Discharge: 2017-12-07 | Disposition: A | Discharge status: Home / Self Care | Payer: Medicare Other | Source: Ambulatory Visit | Attending: Internal Medicine | Admitting: Internal Medicine

## 2017-12-07 DIAGNOSIS — Z1231 Encounter for screening mammogram for malignant neoplasm of breast: Secondary | ICD-10-CM | POA: Insufficient documentation

## 2017-12-28 NOTE — Telephone Encounter (Signed)
No CB closing TE. -MM

## 2018-01-06 DIAGNOSIS — D500 Iron deficiency anemia secondary to blood loss (chronic): Secondary | ICD-10-CM | POA: Diagnosis not present

## 2018-01-06 DIAGNOSIS — I780 Hereditary hemorrhagic telangiectasia: Secondary | ICD-10-CM | POA: Diagnosis not present

## 2018-01-06 DIAGNOSIS — R040 Epistaxis: Secondary | ICD-10-CM | POA: Diagnosis not present

## 2018-01-06 DIAGNOSIS — K5520 Angiodysplasia of colon without hemorrhage: Secondary | ICD-10-CM | POA: Diagnosis not present

## 2018-02-11 DIAGNOSIS — F329 Major depressive disorder, single episode, unspecified: Secondary | ICD-10-CM | POA: Diagnosis not present

## 2018-02-19 ENCOUNTER — Other Ambulatory Visit: Payer: Self-pay | Admitting: Physician Assistant

## 2018-02-19 DIAGNOSIS — F3342 Major depressive disorder, recurrent, in full remission: Secondary | ICD-10-CM

## 2018-02-28 ENCOUNTER — Ambulatory Visit
Admission: EM | Admit: 2018-02-28 | Discharge: 2018-02-28 | Disposition: A | Discharge status: Home / Self Care | Payer: Medicare Other | Attending: Family Medicine | Admitting: Family Medicine

## 2018-02-28 ENCOUNTER — Encounter: Payer: Self-pay | Admitting: Emergency Medicine

## 2018-02-28 ENCOUNTER — Other Ambulatory Visit: Payer: Self-pay

## 2018-02-28 DIAGNOSIS — J209 Acute bronchitis, unspecified: Secondary | ICD-10-CM | POA: Diagnosis not present

## 2018-02-28 MED ORDER — HYDROCOD POLST-CPM POLST ER 10-8 MG/5ML PO SUER: 3.0000 mL | Freq: Every evening | ORAL | 0 refills | Status: AC | PRN

## 2018-02-28 MED ORDER — BENZONATATE 100 MG PO CAPS: 100.0000 mg | ORAL_CAPSULE | Freq: Three times a day (TID) | ORAL | 0 refills | Status: AC | PRN

## 2018-02-28 MED ORDER — DOXYCYCLINE HYCLATE 100 MG PO CAPS: 100.0000 mg | ORAL_CAPSULE | Freq: Two times a day (BID) | ORAL | 0 refills | Status: AC

## 2018-02-28 NOTE — Discharge Instructions (Addendum)
Take medication as prescribed. Rest. Drink plenty of fluids.  ° °Follow up with your primary care physician this week as needed. Return to Urgent care for new or worsening concerns.  ° °

## 2018-02-28 NOTE — ED Triage Notes (Signed)
Pt c/o cough. Started about a week and a half ago. She also has a headache, nasal congestion, chest congestion. Denies body aches.

## 2018-02-28 NOTE — ED Provider Notes (Signed)
[facility] ____________________________________________  Time seen: Approximately 12:35 PM  I have reviewed the triage vital signs and the nursing notes.   HISTORY  Chief Complaint Cough   HPI Faith Thompson is a 73 y.o. female presenting for evaluation of 1.5 weeks of cough and chest congestion.  States initially thought it was only allergies after clearing out some Christmas decorations.  States cough has continued, intermittent coughing fits.  Some nasal congestion, no profuse nasal congestion or drainage and no sinus pain.  Denies accompanying fevers.  States continues to overall eat and drink well.  Occasional throat irritation, denies current sore throat.  Denies chest pain or shortness of breath.  No atypical lower extremity edema.  Denies hemoptysis.  Did take some over-the-counter cough medication which helps some, no resolution.  Cough has disrupted sleep.  Denies known direct sick contacts.  Denies other aggravating or alleviating factors.  Denies recent sickness.  Mar Daring, PA-C: PCP   Past Medical History:  Diagnosis Date  . Allergic rhinitis, cause unspecified   . Anemia   . Arthritis    knees  . Asthma   . 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  . Basal cell carcinoma 06/09/12   R facial region; s/p Mohs procedure at [facility].  . Depression   . Dysthymic disorder   . Factor V Leiden mutation ([place])   . 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  . Gastritis 11/24/2011   EGD: +gastritis; AVM gastric region, pulsatile mass in stomach  . GERD (gastroesophageal reflux disease)   . Macular degeneration (senile) of retina, unspecified   . Obesity, unspecified   . OSA on CPAP 05/24/2012   sleep study +OSA; CPAP titration placed.  . Overactive bladder   . Ulcer   . Wears dentures    partial upper and lower     Patient Active Problem List   Diagnosis Date Noted  . Macular degeneration, bilateral 03/13/2016  . Osteopenia of multiple sites 03/13/2016  . 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 Epistaxis, recurrent 11/18/2015  . Factor V Leiden ([place]) 09/08/2015  . History of basal cell cancer 09/08/2015  . Upper GI bleeding 09/08/2015  . Restless leg syndrome 10/11/2012  . OSA on CPAP 09/28/2012  . Urinary incontinence 02/29/2012  . Snoring 02/29/2012  . Hypersomnolence 02/29/2012  . Overactive bladder 02/09/2012  . Insomnia 02/09/2012  . AVM (arteriovenous malformation) of stomach, acquired 02/09/2012  . Gastritis 02/09/2012  . Depression 02/09/2012  . Iron deficiency anemia 02/09/2012  . AVM (arteriovenous malformation) of colon with hemorrhage 02/09/2012  . ANXIETY, SITUATIONAL 10/13/2006  . SYMPTOM, DISTURBANCE, SLEEP NOS 10/06/2006  . Obesity 06/10/2006  . Allergic rhinitis 06/10/2006  . GERD 06/10/2006  . OSTEOARTHRITIS 06/10/2006    Past Surgical History:  Procedure Laterality Date  . ABDOMINAL HYSTERECTOMY  1996   fibroids; ovaries removed.    Marland Kitchen CATARACT EXTRACTION W/PHACO Left 09/07/2016   Procedure: CATARACT EXTRACTION PHACO AND INTRAOCULAR LENS PLACEMENT ([place])  Left;  Surgeon: Eulogio Bear, MD;  Location: [place];  Service: Ophthalmology;  Laterality: Left;  IVA TOPICAL sleep apnea  . CATARACT EXTRACTION W/PHACO Right 10/20/2016   Procedure: CATARACT EXTRACTION PHACO AND INTRAOCULAR LENS PLACEMENT ([place])  RIGHT;  Surgeon: Eulogio Bear, MD;  Location: [place];  Service: Ophthalmology;  Laterality: Right;  sleep apnea  . CHOLECYSTECTOMY  1993  .  COLONOSCOPY  10/2011   AVM bleeding in colon s/p cauterization.  Symptoms: anemia, hemoccult +.  . ESOPHAGOGASTRODUODENOSCOPY  10/2011   gastritis, AVM of stomach non-bleeding, pulsatile mass of stomach.  Symptoms: iron defficiency anemia with hemocult +.  .  ESOPHAGOGASTRODUODENOSCOPY  [id number]   NL esophagus.-Bile gastritis.This was biopsied, pulsatile extrinsic mass at the bulb.A few non bleeding angioectasias in the stomach. Sucralfate tab at 1 gram po 2x day indefintely. repeat upper endoscopy prn for surveillance. Schedule an ultrasound of the abdomen to look for aneurysm of gastroduodenal artery.  . Sleep study  05/24/2012   +OSA; CPAP fitted.  . TONSILLECTOMY  age 59     No current facility-administered medications for this encounter.   Current Outpatient Medications:  .  calcium carbonate (OS-CAL) 600 MG TABS, Take 600 mg by mouth 2 (two) times daily with a meal., Disp: , Rfl:  .  cetirizine (ZYRTEC) 10 MG tablet, Take 10 mg by mouth daily., Disp: , Rfl:  .  ferrous sulfate 325 (65 FE) MG tablet, Take 325 mg by mouth daily with breakfast., Disp: , Rfl:  .  omeprazole (PRILOSEC) 20 MG capsule, TAKE 1 CAPSULE BY MOUTH TWICE DAILY, Disp: 90 capsule, Rfl: 3 .  oxybutynin (DITROPAN-XL) 10 MG 24 hr tablet, Take by mouth., Disp: , Rfl:  .  ranitidine (ZANTAC) 150 MG tablet, Take 150 mg by mouth 2 (two) times daily., Disp: , Rfl:  .  sertraline (ZOLOFT) 50 MG tablet, TAKE ONE TABLET BY MOUTH EVERY DAY, Disp: 90 tablet, Rfl: 1 .  aluminum hydroxide-magnesium carbonate (GAVISCON) 95-358 MG/15ML SUSP, Take by mouth daily., Disp: , Rfl:  .  benzonatate (TESSALON PERLES) 100 MG capsule, Take 1 capsule (100 mg total) by mouth 3 (three) times daily as needed., Disp: 15 capsule, Rfl: 0 .  chlorpheniramine-HYDROcodone (TUSSIONEX PENNKINETIC ER) 10-8 MG/5ML SUER, Take 3 mLs by mouth at bedtime as needed. do not drive or operate machinery while taking as can cause drowsiness., Disp: 40 mL, Rfl: 0 .  doxycycline (VIBRAMYCIN) 100 MG capsule, Take 1 capsule (100 mg total) by mouth 2 (two) times daily., Disp: 20 capsule, Rfl: 0 .  Lutein 6 MG CAPS, Take by mouth daily., Disp: , Rfl:  .  Multiple Vitamins-Minerals (ICAPS AREDS 2 PO), Take by mouth 2 (two) times  daily., Disp: , Rfl:  .  Multiple Vitamins-Minerals (PRESERVISION AREDS 2 PO), Take by mouth., Disp: , Rfl:  .  triamcinolone cream (KENALOG) 0.1 %, Apply 1 application topically 2 (two) times daily., Disp: 30 g, Rfl: 0  Allergies Nsaids; Codeine; Estrogens; and Adhesive [tape]  Family History  Problem Relation Age of Onset  . Diabetes Mother   . Cancer Father        lung  . Hypertension Sister   . Arthritis Sister   . Heart disease Maternal Uncle   . Heart murmur Sister   . Mental illness Sister        anxiety  . Stroke Paternal Grandmother   . Breast cancer Cousin        maternal side    Social History Social History   Tobacco Use  . Smoking status: Never Smoker  . Smokeless tobacco: Never Used  Substance Use Topics  . Alcohol use: Yes    Alcohol/week: 0.0 standard drinks    Comment: occasional very rarely social events  . Drug use: No    Review of Systems Constitutional: No fever. Some chills Eyes: No visual changes. ENT: as above.  Cardiovascular: Denies  chest pain. Respiratory: Denies shortness of breath. Gastrointestinal: No abdominal pain.   Musculoskeletal: Negative for back pain. Skin: Negative for rash.   ____________________________________________   PHYSICAL EXAM:  VITAL SIGNS: ED Triage Vitals  Enc Vitals Group     BP 02/28/18 1129 (!) 141/64     Pulse Rate 02/28/18 1129 79     Resp 02/28/18 1129 16     Temp 02/28/18 1129 98.2 F (36.8 C)     Temp Source 02/28/18 1129 Oral     SpO2 02/28/18 1129 99 %     Weight 02/28/18 1124 249 lb (112.9 kg)     Height 02/28/18 1124 5' 1.5" (1.562 m)     Head Circumference --      Peak Flow --      Pain Score 02/28/18 1123 0     Pain Loc --      Pain Edu? --      Excl. in [place]? --     Constitutional: Alert and oriented. Well appearing and in no acute distress. Eyes: Conjunctivae are normal. Head: Atraumatic. No sinus tenderness to palpation. No swelling. No erythema.  Ears: no erythema, normal TMs  bilaterally.   Nose:Nasal congestion   Mouth/Throat: Mucous membranes are moist. No pharyngeal erythema. No tonsillar swelling or exudate.  Neck: No stridor.  No cervical spine tenderness to palpation. Hematological/Lymphatic/Immunilogical: No cervical lymphadenopathy. Cardiovascular: Normal rate, regular rhythm. Grossly normal heart sounds.  Good peripheral circulation. Respiratory: Normal respiratory effort.  No retractions. No wheezes, rales or rhonchi. Good air movement. Dry intermittent cough in room. Musculoskeletal: Ambulatory with steady gait.  Neurologic:  Normal speech and language. No gait instability. Skin:  Skin appears warm, dry and intact. No rash noted. Psychiatric: Mood and affect are normal. Speech and behavior are normal. ___________________________________________   LABS (all labs ordered are listed, but only abnormal results are displayed)  Labs Reviewed - No data to display  PROCEDURES Procedures  _______________________________________   INITIAL IMPRESSION / ASSESSMENT AND PLAN / ED COURSE  Pertinent labs & imaging results that were available during my care of the patient were reviewed by me and considered in my medical decision making (see chart for details).  Well-appearing patient.  No acute distress.  Suspect viral upper respiratory infection.  Multiple comorbidities, will treat with PRN Tessalon Perles and PRN Tussionex and oral doxycycline.  Encourage rest, fluids, supportive care.  Discussed her follow-up and return parameters. Discussed indication, risks and benefits of medications with patient.  Discussed follow up with Primary care physician this week. Discussed follow up and return parameters including no resolution or any worsening concerns. Patient verbalized understanding and agreed to plan.   ____________________________________________   FINAL CLINICAL IMPRESSION(S) / ED DIAGNOSES  Final diagnoses:  Acute bronchitis, unspecified organism      ED Discharge Orders         Ordered    doxycycline (VIBRAMYCIN) 100 MG capsule  2 times daily     02/28/18 1223    benzonatate (TESSALON PERLES) 100 MG capsule  3 times daily PRN     02/28/18 1223    chlorpheniramine-HYDROcodone (TUSSIONEX PENNKINETIC ER) 10-8 MG/5ML SUER  At bedtime PRN     02/28/18 1223           Note: This dictation was prepared with Dragon dictation along with smaller phrase technology. Any transcriptional errors that result from this process are unintentional.         Marylene Land, NP 02/28/18 1240

## 2018-04-12 DIAGNOSIS — R12 Heartburn: Secondary | ICD-10-CM | POA: Diagnosis not present

## 2018-04-18 ENCOUNTER — Telehealth: Payer: Self-pay | Admitting: Physician Assistant

## 2018-04-18 NOTE — Telephone Encounter (Signed)
I left a message asking the patient to call and schedule AWV with McKenzie. VDM (DD)

## 2018-05-16 ENCOUNTER — Other Ambulatory Visit: Payer: Self-pay | Admitting: Physician Assistant

## 2018-05-16 DIAGNOSIS — F3342 Major depressive disorder, recurrent, in full remission: Secondary | ICD-10-CM

## 2018-06-02 DIAGNOSIS — N3946 Mixed incontinence: Secondary | ICD-10-CM | POA: Diagnosis not present

## 2018-07-07 DIAGNOSIS — R040 Epistaxis: Secondary | ICD-10-CM | POA: Diagnosis not present

## 2018-07-07 DIAGNOSIS — I780 Hereditary hemorrhagic telangiectasia: Secondary | ICD-10-CM | POA: Diagnosis not present

## 2018-07-07 DIAGNOSIS — D500 Iron deficiency anemia secondary to blood loss (chronic): Secondary | ICD-10-CM | POA: Diagnosis not present

## 2018-07-07 DIAGNOSIS — D6851 Activated protein C resistance: Secondary | ICD-10-CM | POA: Diagnosis not present

## 2018-08-03 DIAGNOSIS — H60339 Swimmer's ear, unspecified ear: Secondary | ICD-10-CM | POA: Diagnosis not present

## 2018-08-11 DIAGNOSIS — H60339 Swimmer's ear, unspecified ear: Secondary | ICD-10-CM | POA: Diagnosis not present

## 2018-08-29 DIAGNOSIS — F341 Dysthymic disorder: Secondary | ICD-10-CM | POA: Diagnosis not present

## 2018-08-29 DIAGNOSIS — K219 Gastro-esophageal reflux disease without esophagitis: Secondary | ICD-10-CM | POA: Diagnosis not present

## 2018-09-23 ENCOUNTER — Other Ambulatory Visit: Payer: Self-pay

## 2018-10-28 DIAGNOSIS — R21 Rash and other nonspecific skin eruption: Secondary | ICD-10-CM | POA: Diagnosis not present

## 2018-11-17 DIAGNOSIS — H353132 Nonexudative age-related macular degeneration, bilateral, intermediate dry stage: Secondary | ICD-10-CM | POA: Diagnosis not present

## 2018-12-06 DIAGNOSIS — Z23 Encounter for immunization: Secondary | ICD-10-CM | POA: Diagnosis not present

## 2019-01-09 DIAGNOSIS — G44211 Episodic tension-type headache, intractable: Secondary | ICD-10-CM | POA: Diagnosis not present

## 2019-01-09 DIAGNOSIS — D6851 Activated protein C resistance: Secondary | ICD-10-CM | POA: Diagnosis not present

## 2019-01-09 DIAGNOSIS — D500 Iron deficiency anemia secondary to blood loss (chronic): Secondary | ICD-10-CM | POA: Diagnosis not present

## 2019-01-09 DIAGNOSIS — I780 Hereditary hemorrhagic telangiectasia: Secondary | ICD-10-CM | POA: Diagnosis not present

## 2019-01-13 DIAGNOSIS — D500 Iron deficiency anemia secondary to blood loss (chronic): Secondary | ICD-10-CM | POA: Diagnosis not present

## 2019-01-13 DIAGNOSIS — G44211 Episodic tension-type headache, intractable: Secondary | ICD-10-CM | POA: Diagnosis not present

## 2019-01-13 DIAGNOSIS — I780 Hereditary hemorrhagic telangiectasia: Secondary | ICD-10-CM | POA: Diagnosis not present

## 2019-01-26 DIAGNOSIS — R0789 Other chest pain: Secondary | ICD-10-CM | POA: Diagnosis not present

## 2019-01-27 DIAGNOSIS — R7989 Other specified abnormal findings of blood chemistry: Secondary | ICD-10-CM | POA: Diagnosis not present

## 2019-01-27 DIAGNOSIS — R0789 Other chest pain: Secondary | ICD-10-CM | POA: Diagnosis not present

## 2019-08-22 ENCOUNTER — Other Ambulatory Visit: Payer: Self-pay | Admitting: Internal Medicine

## 2019-08-22 DIAGNOSIS — Z1231 Encounter for screening mammogram for malignant neoplasm of breast: Secondary | ICD-10-CM

## 2019-09-15 ENCOUNTER — Ambulatory Visit
Admission: RE | Admit: 2019-09-15 | Discharge: 2019-09-15 | Disposition: A | Discharge status: Home / Self Care | Payer: Medicare Other | Source: Ambulatory Visit | Attending: Internal Medicine | Admitting: Internal Medicine

## 2019-09-15 DIAGNOSIS — Z1231 Encounter for screening mammogram for malignant neoplasm of breast: Secondary | ICD-10-CM | POA: Diagnosis present

## 2020-09-25 ENCOUNTER — Other Ambulatory Visit: Payer: Self-pay | Admitting: Internal Medicine

## 2020-09-25 DIAGNOSIS — Z1231 Encounter for screening mammogram for malignant neoplasm of breast: Secondary | ICD-10-CM

## 2020-10-09 ENCOUNTER — Other Ambulatory Visit: Payer: Self-pay

## 2020-10-09 ENCOUNTER — Ambulatory Visit
Admission: RE | Admit: 2020-10-09 | Discharge: 2020-10-09 | Disposition: A | Discharge status: Home / Self Care | Payer: Medicare Other | Source: Ambulatory Visit | Attending: Internal Medicine | Admitting: Internal Medicine

## 2020-10-09 DIAGNOSIS — Z1231 Encounter for screening mammogram for malignant neoplasm of breast: Secondary | ICD-10-CM

## 2021-12-30 ENCOUNTER — Other Ambulatory Visit: Payer: Self-pay

## 2021-12-30 DIAGNOSIS — Z1231 Encounter for screening mammogram for malignant neoplasm of breast: Secondary | ICD-10-CM

## 2022-02-03 ENCOUNTER — Ambulatory Visit
Admission: RE | Admit: 2022-02-03 | Discharge: 2022-02-03 | Disposition: A | Discharge status: Home / Self Care | Payer: Medicare Other | Source: Ambulatory Visit | Attending: Internal Medicine | Admitting: Internal Medicine

## 2022-02-03 DIAGNOSIS — Z1231 Encounter for screening mammogram for malignant neoplasm of breast: Secondary | ICD-10-CM | POA: Diagnosis not present

## 2023-01-01 ENCOUNTER — Other Ambulatory Visit: Payer: Self-pay | Admitting: Internal Medicine

## 2023-01-01 DIAGNOSIS — Z1231 Encounter for screening mammogram for malignant neoplasm of breast: Secondary | ICD-10-CM

## 2023-02-18 ENCOUNTER — Ambulatory Visit
Admission: RE | Admit: 2023-02-18 | Discharge: 2023-02-18 | Disposition: A | Discharge status: Home / Self Care | Payer: Medicare Other | Source: Ambulatory Visit | Attending: Internal Medicine | Admitting: Internal Medicine

## 2023-02-18 DIAGNOSIS — Z1231 Encounter for screening mammogram for malignant neoplasm of breast: Secondary | ICD-10-CM | POA: Diagnosis present
